# Patient Record
Sex: Female | Born: 2000 | Race: White | Hispanic: No | Marital: Single | Smoking: Current some day smoker
Health system: Southern US, Community
[De-identification: ages and names within clinical notes are randomized; demographics above are authoritative.]

## PROBLEM LIST (undated history)

## (undated) DIAGNOSIS — H539 Unspecified visual disturbance: Secondary | ICD-10-CM

## (undated) DIAGNOSIS — M94269 Chondromalacia, unspecified knee: Secondary | ICD-10-CM

## (undated) DIAGNOSIS — F329 Major depressive disorder, single episode, unspecified: Secondary | ICD-10-CM

## (undated) DIAGNOSIS — F419 Anxiety disorder, unspecified: Secondary | ICD-10-CM

## (undated) DIAGNOSIS — F32A Depression, unspecified: Secondary | ICD-10-CM

## (undated) DIAGNOSIS — M25361 Other instability, right knee: Secondary | ICD-10-CM

---
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
## 2018-04-28 ENCOUNTER — Other Ambulatory Visit: Payer: Self-pay

## 2018-04-28 ENCOUNTER — Encounter (HOSPITAL_COMMUNITY): Payer: Self-pay | Admitting: Emergency Medicine

## 2018-04-28 DIAGNOSIS — F419 Anxiety disorder, unspecified: Secondary | ICD-10-CM | POA: Insufficient documentation

## 2018-04-28 DIAGNOSIS — F329 Major depressive disorder, single episode, unspecified: Secondary | ICD-10-CM | POA: Insufficient documentation

## 2018-04-28 DIAGNOSIS — R45851 Suicidal ideations: Secondary | ICD-10-CM | POA: Insufficient documentation

## 2018-04-28 DIAGNOSIS — F17200 Nicotine dependence, unspecified, uncomplicated: Secondary | ICD-10-CM | POA: Insufficient documentation

## 2018-04-28 DIAGNOSIS — Z79899 Other long term (current) drug therapy: Secondary | ICD-10-CM | POA: Insufficient documentation
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
## 2018-04-29 ENCOUNTER — Emergency Department (HOSPITAL_COMMUNITY)
Admission: EM | Admit: 2018-04-29 | Discharge: 2018-04-29 | Disposition: A | Discharge status: Other Acute Inpatient Hospital | Payer: Medicaid Other | Attending: Emergency Medicine | Admitting: Emergency Medicine

## 2018-04-29 ENCOUNTER — Encounter (HOSPITAL_COMMUNITY): Payer: Self-pay | Admitting: Emergency Medicine

## 2018-04-29 ENCOUNTER — Other Ambulatory Visit: Payer: Self-pay

## 2018-04-29 ENCOUNTER — Inpatient Hospital Stay (HOSPITAL_COMMUNITY)
Admission: AD | Admit: 2018-04-29 | Discharge: 2018-05-04 | DRG: 885 | Disposition: A | Discharge status: Home / Self Care | Payer: Medicaid Other | Source: Intra-hospital | Attending: Psychiatry | Admitting: Psychiatry

## 2018-04-29 ENCOUNTER — Encounter (HOSPITAL_COMMUNITY): Payer: Self-pay | Admitting: *Deleted

## 2018-04-29 DIAGNOSIS — Z818 Family history of other mental and behavioral disorders: Secondary | ICD-10-CM | POA: Diagnosis not present

## 2018-04-29 DIAGNOSIS — Z8719 Personal history of other diseases of the digestive system: Secondary | ICD-10-CM | POA: Diagnosis not present

## 2018-04-29 DIAGNOSIS — F603 Borderline personality disorder: Secondary | ICD-10-CM | POA: Diagnosis present

## 2018-04-29 DIAGNOSIS — R45851 Suicidal ideations: Secondary | ICD-10-CM | POA: Diagnosis present

## 2018-04-29 DIAGNOSIS — F481 Depersonalization-derealization syndrome: Secondary | ICD-10-CM | POA: Diagnosis present

## 2018-04-29 DIAGNOSIS — Z91410 Personal history of adult physical and sexual abuse: Secondary | ICD-10-CM

## 2018-04-29 DIAGNOSIS — F333 Major depressive disorder, recurrent, severe with psychotic symptoms: Principal | ICD-10-CM | POA: Diagnosis present

## 2018-04-29 DIAGNOSIS — F4320 Adjustment disorder, unspecified: Secondary | ICD-10-CM | POA: Diagnosis present

## 2018-04-29 DIAGNOSIS — F32A Depression, unspecified: Secondary | ICD-10-CM

## 2018-04-29 DIAGNOSIS — F419 Anxiety disorder, unspecified: Secondary | ICD-10-CM | POA: Diagnosis present

## 2018-04-29 DIAGNOSIS — F17290 Nicotine dependence, other tobacco product, uncomplicated: Secondary | ICD-10-CM | POA: Diagnosis present

## 2018-04-29 DIAGNOSIS — G4700 Insomnia, unspecified: Secondary | ICD-10-CM | POA: Diagnosis present

## 2018-04-29 DIAGNOSIS — F1290 Cannabis use, unspecified, uncomplicated: Secondary | ICD-10-CM | POA: Diagnosis present

## 2018-04-29 DIAGNOSIS — F329 Major depressive disorder, single episode, unspecified: Secondary | ICD-10-CM

## 2018-04-29 LAB — COMPREHENSIVE METABOLIC PANEL
ALT: 12 U/L (ref 0–44)
AST: 15 U/L (ref 15–41)
Albumin: 4.2 g/dL (ref 3.5–5.0)
Alkaline Phosphatase: 29 U/L — ABNORMAL LOW (ref 38–126)
Anion gap: 8 (ref 5–15)
BUN: 12 mg/dL (ref 6–20)
CHLORIDE: 107 mmol/L (ref 98–111)
CO2: 24 mmol/L (ref 22–32)
CREATININE: 0.86 mg/dL (ref 0.44–1.00)
Calcium: 9.7 mg/dL (ref 8.9–10.3)
Glucose, Bld: 100 mg/dL — ABNORMAL HIGH (ref 70–99)
POTASSIUM: 3.8 mmol/L (ref 3.5–5.1)
Sodium: 139 mmol/L (ref 135–145)
TOTAL PROTEIN: 7.3 g/dL (ref 6.5–8.1)
Total Bilirubin: 0.4 mg/dL (ref 0.3–1.2)

## 2018-04-29 LAB — ETHANOL

## 2018-04-29 LAB — SALICYLATE LEVEL

## 2018-04-29 LAB — CBC
HEMATOCRIT: 40.1 % (ref 36.0–46.0)
Hemoglobin: 13.2 g/dL (ref 12.0–15.0)
MCH: 28.5 pg (ref 26.0–34.0)
MCHC: 32.9 g/dL (ref 30.0–36.0)
MCV: 86.6 fL (ref 80.0–100.0)
NRBC: 0 % (ref 0.0–0.2)
Platelets: 292 10*3/uL (ref 150–400)
RBC: 4.63 MIL/uL (ref 3.87–5.11)
RDW: 11.5 % (ref 11.5–15.5)
WBC: 9.3 10*3/uL (ref 4.0–10.5)

## 2018-04-29 LAB — RAPID URINE DRUG SCREEN, HOSP PERFORMED
Amphetamines: NOT DETECTED
BARBITURATES: NOT DETECTED
BENZODIAZEPINES: NOT DETECTED
Cocaine: NOT DETECTED
Opiates: NOT DETECTED
Tetrahydrocannabinol: NOT DETECTED

## 2018-04-29 LAB — PREGNANCY, URINE: Preg Test, Ur: NEGATIVE

## 2018-04-29 LAB — ACETAMINOPHEN LEVEL: Acetaminophen (Tylenol), Serum: <10 ug/mL — ABNORMAL LOW (ref 10–30)

## 2018-04-29 MED ORDER — ACETAMINOPHEN 325 MG PO TABS: 650.0000 mg | ORAL_TABLET | Freq: Four times a day (QID) | ORAL | Status: DC | PRN

## 2018-04-29 MED ORDER — ESCITALOPRAM OXALATE 20 MG PO TABS
20.0000 mg | ORAL_TABLET | Freq: Every day | ORAL | Status: DC
  Administered 2018-04-30 – 2018-05-04 (×5): 20 mg via ORAL
  Filled 2018-04-29 (×6): qty 1

## 2018-04-29 MED ORDER — LAMOTRIGINE 100 MG PO TABS
100.0000 mg | ORAL_TABLET | Freq: Every day | ORAL | Status: DC
  Administered 2018-04-29: 100 mg via ORAL
  Filled 2018-04-29: qty 1

## 2018-04-29 MED ORDER — ALUM & MAG HYDROXIDE-SIMETH 200-200-20 MG/5ML PO SUSP: 30.0000 mL | ORAL | Status: DC | PRN

## 2018-04-29 MED ORDER — ESCITALOPRAM OXALATE 10 MG PO TABS
20.0000 mg | ORAL_TABLET | Freq: Every day | ORAL | Status: DC
  Administered 2018-04-29: 20 mg via ORAL
  Filled 2018-04-29: qty 2

## 2018-04-29 MED ORDER — QUETIAPINE FUMARATE 50 MG PO TABS
50.0000 mg | ORAL_TABLET | Freq: Every day | ORAL | Status: DC
  Administered 2018-04-29: 100 mg via ORAL
  Filled 2018-04-29 (×3): qty 2

## 2018-04-29 MED ORDER — LAMOTRIGINE 100 MG PO TABS
100.0000 mg | ORAL_TABLET | Freq: Every day | ORAL | Status: DC
  Administered 2018-04-29 – 2018-05-03 (×5): 100 mg via ORAL
  Filled 2018-04-29 (×8): qty 1

## 2018-04-29 MED ORDER — QUETIAPINE FUMARATE 50 MG PO TABS
50.0000 mg | ORAL_TABLET | Freq: Every day | ORAL | Status: DC
  Administered 2018-04-29: 50 mg via ORAL
  Filled 2018-04-29: qty 1

## 2018-04-29 MED ORDER — MAGNESIUM HYDROXIDE 400 MG/5ML PO SUSP: 30.0000 mL | Freq: Every day | ORAL | Status: DC | PRN

## 2018-04-29 MED ORDER — NICOTINE 21 MG/24HR TD PT24
21.0000 mg | MEDICATED_PATCH | Freq: Every day | TRANSDERMAL | Status: DC
  Administered 2018-04-30 – 2018-05-03 (×5): 21 mg via TRANSDERMAL
  Filled 2018-04-29 (×9): qty 1

## 2018-04-29 NOTE — ED Notes (Signed)
Pt discharged safely with [facility] driver.  Pt was in no distress.  No belongings to send as they went home with family.

## 2018-04-29 NOTE — Plan of Care (Signed)
Nurse discussed anxiety, depression, coping skills with patient. 

## 2018-04-29 NOTE — BH Assessment (Addendum)
[facility] Assessment Progress Note  Per Juanetta Beets, DO, this pt requires psychiatric hospitalization at this time.  Thurman Coyer, RN, [facility] has pre-assigned pt to [facility] Rm [id number] in anticipation of a discharge scheduled for later today; [facility] will call when they are ready to receive pt.  Pt has signed Voluntary Admission and Consent for Treatment, as well as Consent to Release Information to pt's providers at [facility], as well as the counseling center at [facility], and notification calls have been placed.  Signed forms have been faxed to [facility].  Pt's nurse, Kendal Hymen, has been notified, and agrees to send original paperwork along with pt via Juel Burrow, and to call report to [phone].  Doylene Canning, [place] Behavioral Health Coordinator [phone]   Addendum:  Per Arloa Koh, RN, [facility] is now ready to receive pt.  Pt's nurse, Kendal Hymen, has been notified.  Doylene Canning, [place] Behavioral Health Coordinator [phone]

## 2018-04-29 NOTE — ED Notes (Signed)
Bed: [facility] Expected date:  Expected time:  Means of arrival:  Comments: 
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
## 2018-04-29 NOTE — ED Notes (Signed)
Bed: [facility] Expected date:  Expected time:  Means of arrival:  Comments: Hold for triage 4 

## 2018-04-29 NOTE — ED Notes (Signed)
Pt alert and oriented, pt pleasant and cooperative, Pt denies any pain, si, hi,or avh at this time. Pt answered all questions approprietly. Pt stable will continue to monitor.
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
## 2018-04-29 NOTE — ED Notes (Signed)
[name], PA, patient meets inpatient criteria. Selena Batten, [facility], no open beds at [facility]. TTS will secure placement.
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
## 2018-04-30 DIAGNOSIS — F333 Major depressive disorder, recurrent, severe with psychotic symptoms: Principal | ICD-10-CM

## 2018-04-30 DIAGNOSIS — F419 Anxiety disorder, unspecified: Secondary | ICD-10-CM

## 2018-04-30 DIAGNOSIS — F603 Borderline personality disorder: Secondary | ICD-10-CM

## 2018-04-30 MED ORDER — HYDROXYZINE HCL 50 MG PO TABS
50.0000 mg | ORAL_TABLET | Freq: Four times a day (QID) | ORAL | Status: DC | PRN
  Filled 2018-04-30: qty 10

## 2018-04-30 MED ORDER — QUETIAPINE FUMARATE 200 MG PO TABS
200.0000 mg | ORAL_TABLET | Freq: Every day | ORAL | Status: DC
  Administered 2018-04-30 – 2018-05-03 (×4): 200 mg via ORAL
  Filled 2018-04-30 (×6): qty 1

## 2018-04-30 NOTE — Progress Notes (Signed)
Patient self inventory- Patient slept fair last night, sleep medication was requested and was helpful. Appetite has been good, energy level low, and concentration good. Depression, hopelessness, and anxiety rated 10, 10, 8. Denies withfrawal symptoms. Denies SI HI AVH. Denies physical pain. Patient said they do not have a goal today.  Patient is compliant with medications prescribed per protocol. No side effects noted.  Safety is maintained with 15 minute checks as well as environmental checks. Will continue to monitor.

## 2018-04-30 NOTE — BHH Suicide Risk Assessment (Signed)
[facility] Admission Suicide Risk Assessment   Nursing information obtained from:  Patient Demographic factors:  Adolescent or young adult Current Mental Status:  Suicidal ideation indicated by patient Loss Factors:  NA Historical Factors:  Prior suicide attempts Risk Reduction Factors:  Employed, Living with another person, especially a relative  Total Time spent with patient: 1 hour Principal Problem: MDD (major depressive disorder), recurrent, severe, with psychosis (HCC) Diagnosis:   Patient Active Problem List   Diagnosis Date Noted  . Borderline personality disorder (HCC) [F60.3] 04/30/2018  . MDD (major depressive disorder), recurrent, severe, with psychosis (HCC) [F33.3] 04/29/2018  . Depressive disorder [F32.9] 06/20/2015  . 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 Migraine without aura and without status migrainosus, not intractable [G43.009] 09/18/2014  . Episodic tension type headache [G44.219] 09/18/2014   Subjective Data: see H&P  Continued Clinical Symptoms:  Alcohol Use Disorder Identification Test Final Score (AUDIT): 2 The "Alcohol Use Disorders Identification Test", Guidelines for Use in Primary Care, Second Edition.  World Science writer [facility]). Score between 0-7:  no or low risk or alcohol related problems. Score between 8-15:  moderate risk of alcohol related problems. Score between 16-19:  high risk of alcohol related problems. Score 20 or above:  warrants further diagnostic evaluation for alcohol dependence and treatment.   CLINICAL FACTORS:   Depression:   Insomnia Severe Personality Disorders:   Cluster B More than one psychiatric diagnosis   Psychiatric Specialty Exam: Physical Exam  Nursing note and vitals reviewed.     Blood pressure 104/70, pulse 90, temperature 98.3 F (36.8 C), temperature source Oral, resp. rate 16, height 5' 2.5" (1.588 m), weight 57.6 kg, last menstrual period 04/27/2018.Body mass index is 22.86 kg/m.     COGNITIVE FEATURES THAT CONTRIBUTE TO RISK:  Polarized thinking    SUICIDE RISK:   Moderate:  Frequent suicidal ideation with limited intensity, and duration, some specificity in terms of plans, no associated intent, good self-control, limited dysphoria/symptomatology, some risk factors present, and identifiable protective factors, including available and accessible social support.  PLAN OF CARE: see H&P  I certify that inpatient services furnished can reasonably be expected to improve the patient's condition.   Micheal Likens, MD 04/30/2018, 12:29 PM

## 2018-04-30 NOTE — Progress Notes (Signed)
Recreation Therapy Notes  Date: 11.1.19 Time: 0930 Location: [address] Dayroom  Group Topic: Stress Management  Goal Area(s) Addresses:  Patient will verbalize importance of using healthy stress management.  Patient will identify positive emotions associated with healthy stress management.   Intervention: Stress Management  Activity :  Guided Imagery.  LRT introduced the stress management technique of guided imagery.  LRT read a script allowing patients to visualize being in a peaceful meadow.  Patients were to listen as script was read to engage in the meditation.  Education:  Stress Management, Discharge Planning.   Education Outcome: Acknowledges edcuation/In group clarification offered/Needs additional education  Clinical Observations/Feedback: Pt did not attend group.      [name], LRT/CTRS         [name] A 04/30/2018 11:37 AM

## 2018-04-30 NOTE — BHH Suicide Risk Assessment (Signed)
[facility] INPATIENT:  Family/Significant Other Suicide Prevention Education  Suicide Prevention Education:  Education Completed; with mother, Caedyn Tassinari [phone]) has been identified by the patient as the family member/significant other with whom the patient will be residing, and identified as the person(s) who will aid the patient in the event of a mental health crisis (suicidal ideations/suicide attempt).  With written consent from the patient, the family member/significant other has been provided the following suicide prevention education, prior to the and/or following the discharge of the patient.  The suicide prevention education provided includes the following:  Suicide risk factors  Suicide prevention and interventions  National Suicide Hotline telephone number  [facility] assessment telephone number  [facility] Emergency Assistance 911  [facility] and/or Residential Mobile Crisis Unit telephone number  Request made of family/significant other to:  Remove weapons (e.g., guns, rifles, knives), all items previously/currently identified as safety concern.  Annabelle Harman confirmed that there were no guns in the home.  Remove drugs/medications (over-the-counter, prescriptions, illicit drugs), all items previously/currently identified as a safety concern.  The family member/significant other verbalizes understanding of the suicide prevention education information provided.  The family member/significant other agrees to remove the items of safety concern listed above.  Cherie Bohaboy 04/30/2018, 4:02 PM

## 2018-04-30 NOTE — Plan of Care (Signed)
  Problem: Education: Goal: Knowledge of [facility] Education information/materials will improve Outcome: Progressing Goal: Verbalization of understanding the information provided will improve Outcome: Progressing   Problem: Activity: Goal: Sleeping patterns will improve Outcome: Progressing   Problem: Education: Goal: Emotional status will improve Outcome: Not Progressing Goal: Mental status will improve Outcome: Not Progressing
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
## 2018-04-30 NOTE — Plan of Care (Signed)
  Problem: Activity: Goal: Sleeping patterns will improve Outcome: Progressing Note:  Slept 6.75 hours last night per flowsheet.    

## 2018-04-30 NOTE — Tx Team (Signed)
Interdisciplinary Treatment and Diagnostic Plan Update  04/30/2018 Time of Session:  [name] MRN: [id number]  Principal Diagnosis: <principal problem not specified>  Secondary Diagnoses: Active Problems:   MDD (major depressive disorder), recurrent severe, without psychosis ([place])   Current Medications:  Current Facility-Administered Medications  Medication Dose Route Frequency Provider Last Rate Last Dose  . acetaminophen (TYLENOL) tablet 650 mg  650 mg Oral Q6H PRN [name], NP      . alum & mag hydroxide-simeth (MAALOX/MYLANTA) 200-200-20 MG/5ML suspension 30 mL  30 mL Oral Q4H PRN [name], NP      . escitalopram (LEXAPRO) tablet 20 mg  20 mg Oral Daily [name], NP   20 mg at 04/30/18 0753  . lamoTRIgine (LAMICTAL) tablet 100 mg  100 mg Oral QHS [name], NP   100 mg at 04/29/18 2127  . magnesium hydroxide (MILK OF MAGNESIA) suspension 30 mL  30 mL Oral Daily PRN [name], NP      . nicotine (NICODERM CQ - dosed in mg/24 hours) patch 21 mg  21 mg Transdermal Daily [name], [name], MD   21 mg at 04/30/18 0753  . QUEtiapine (SEROQUEL) tablet 50-100 mg  50-100 mg Oral QHS [name], NP   100 mg at 04/29/18 2128   PTA Medications: Medications Prior to Admission  Medication Sig Dispense Refill Last Dose  . escitalopram (LEXAPRO) 20 MG tablet Take 20 mg by mouth daily.  1 Past Week at Unknown time  . lamoTRIgine (LAMICTAL) 100 MG tablet Take 100 mg by mouth at bedtime.   Past Week at Unknown time  . norethindrone-ethinyl estradiol (MICROGESTIN,JUNEL,LOESTRIN) 1-20 MG-MCG tablet Take 1 tablet by mouth daily.  13 Past Week at Unknown time  . QUEtiapine (SEROQUEL) 100 MG tablet Take 50-100 mg by mouth at bedtime.  0 Past Week at Unknown time    Patient Stressors:    Patient Strengths:    Treatment Modalities: Medication Management, Group therapy, Case management,  1 to 1 session with clinician,  Psychoeducation, Recreational therapy.   Physician Treatment Plan for Primary Diagnosis: <principal problem not specified> Long Term Goal(s):     Short Term Goals:    Medication Management: Evaluate patient's response, side effects, and tolerance of medication regimen.  Therapeutic Interventions: 1 to 1 sessions, Unit Group sessions and Medication administration.  Evaluation of Outcomes: Not Met  Physician Treatment Plan for Secondary Diagnosis: Active Problems:   MDD (major depressive disorder), recurrent severe, without psychosis ([place])  Long Term Goal(s):     Short Term Goals:       Medication Management: Evaluate patient's response, side effects, and tolerance of medication regimen.  Therapeutic Interventions: 1 to 1 sessions, Unit Group sessions and Medication administration.  Evaluation of Outcomes: Not Met   RN Treatment Plan for Primary Diagnosis: <principal problem not specified> Long Term Goal(s): Knowledge of disease and therapeutic regimen to maintain health will improve  Short Term Goals: Ability to verbalize feelings will improve, Ability to disclose and discuss suicidal ideas, Ability to identify and develop effective coping behaviors will improve and Compliance with prescribed medications will improve  Medication Management: RN will administer medications as ordered by provider, will assess and evaluate patient's response and provide education to patient for prescribed medication. RN will report any adverse and/or side effects to prescribing provider.  Therapeutic Interventions: 1 on 1 counseling sessions, Psychoeducation, Medication administration, Evaluate responses to treatment, Monitor vital signs and CBGs as ordered, Perform/monitor  CIWA, COWS, AIMS and Fall Risk screenings as ordered, Perform wound care treatments as ordered.  Evaluation of Outcomes: Not Met   LCSW Treatment Plan for Primary Diagnosis: <principal problem not specified> Long Term Goal(s):  Safe transition to appropriate next level of care at discharge, Engage patient in therapeutic group addressing interpersonal concerns.  Short Term Goals: Engage patient in aftercare planning with referrals and resources  Therapeutic Interventions: Assess for all discharge needs, 1 to 1 time with Social worker, Explore available resources and support systems, Assess for adequacy in community support network, Educate family and significant other(s) on suicide prevention, Complete Psychosocial Assessment, Interpersonal group therapy.  Evaluation of Outcomes: Not Met   Progress in Treatment: Attending groups: No. Participating in groups: No. Taking medication as prescribed: Yes. Toleration medication: Yes. Family/Significant other contact made: No, will contact:  if patient provides consent for collateral contacts Patient understands diagnosis: Yes. Discussing patient identified problems/goals with staff: Yes. Medical problems stabilized or resolved: Yes. Denies suicidal/homicidal ideation: Yes. Issues/concerns per patient self-inventory: No. Other:    New problem(s) identified: None   New Short Term/Long Term Goal(s):Detox, medication stabilization, elimination of SI thoughts, development of comprehensive mental wellness plan.   Patient Goals:    Discharge Plan or Barriers: CSW will assess for appropriate referrals and possible discharge planning.   Reason for Continuation of Hospitalization: Anxiety Depression Medication stabilization Suicidal ideation  Estimated Length of Stay: 3-5 days   Attendees: Patient: 04/30/2018 11:11 AM  Physician: Dr. Maris Berger, MD 04/30/2018 11:11 AM  Nursing: Yetta Flock.Jacinto Reap, RN 04/30/2018 11:11 AM  RN Care Manager: Lars Pinks, RN  04/30/2018 11:11 AM  Social Worker: Radonna Ricker, [place] 04/30/2018 11:11 AM  Recreational Therapist: X 04/30/2018 11:11 AM  Other: Agustina Caroli, NP  04/30/2018 11:11 AM  Other:  04/30/2018 11:11 AM  Other:  04/30/2018 11:11 AM    Scribe for Treatment Team: Marylee Floras, [place] 04/30/2018 11:11 AM
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
## 2018-04-30 NOTE — Progress Notes (Signed)
D: Pt was reading in their room upon initial approach.  Pt presents with depressed affect and mood.  Pt describes their day as "okay."  Pt's goal is to "start brushing my teeth at night."  Pt forwards little information.  Pt reports the best part of their day was "I found somebody to sit with at lunch."  Pt denies SI/HI, denies hallucinations, denies pain.  Pt has been visible in milieu interacting with peers and staff cautiously.    A: Introduced self to pt.  Met with pt 1:1.  Actively listened to pt and offered support and encouragement.  Medications administered per order.  Q15 minute safety checks maintained.  R: Pt is safe on the unit.  Pt is compliant with medications.  Pt verbally contracts for safety.  Will continue to monitor and assess.

## 2018-05-01 LAB — LIPID PANEL
CHOL/HDL RATIO: 5.8 ratio
CHOLESTEROL: 225 mg/dL — AB (ref 0–200)
HDL: 39 mg/dL — ABNORMAL LOW (ref >40)
LDL Cholesterol: 151 mg/dL — ABNORMAL HIGH (ref 0–99)
Triglycerides: 173 mg/dL — ABNORMAL HIGH (ref <150)
VLDL: 35 mg/dL (ref 0–40)

## 2018-05-01 LAB — HEMOGLOBIN A1C
Hgb A1c MFr Bld: 4.7 % — ABNORMAL LOW (ref 4.8–5.6)
MEAN PLASMA GLUCOSE: 88.19 mg/dL

## 2018-05-01 LAB — TSH: TSH: 1.491 u[IU]/mL (ref 0.350–4.500)

## 2018-05-01 MED ORDER — FLUTICASONE PROPIONATE 50 MCG/ACT NA SUSP
2.0000 | Freq: Every day | NASAL | Status: DC
  Administered 2018-05-01: 2 via NASAL
  Filled 2018-05-01: qty 16

## 2018-05-01 MED ORDER — LORATADINE 10 MG PO TABS
10.0000 mg | ORAL_TABLET | Freq: Every day | ORAL | Status: DC
  Administered 2018-05-01 – 2018-05-04 (×4): 10 mg via ORAL
  Filled 2018-05-01 (×5): qty 1

## 2018-05-01 NOTE — [facility] Group Notes (Signed)
Adult Psychoeducational Group Note  Date:  05/01/2018 Time:  9:56 PM  Group Topic/Focus:  Wrap-Up Group:   The focus of this group is to help patients review their daily goal of treatment and discuss progress on daily workbooks.  Participation Level:  Active  Participation Quality:  Appropriate and Attentive  Affect:  Appropriate  Cognitive:  Alert and Appropriate  Insight: Appropriate and Good  Engagement in Group:  Engaged  Modes of Intervention:  Discussion and Education  Additional Comments:  Pt attended and participated in wrap up group this evening. Pt rated their day a 5/10, due to it "not being a terrible day, but not a good day". Pt rested today and was able to see their mother. Pt goal for tomorrow, is to receive information on finding them a therapist.   Chrisandra Netters 05/01/2018, 9:56 PM

## 2018-05-01 NOTE — Progress Notes (Signed)
D: Patient presents depressed, guarded. She slept well last night and received medication that was helpful. Her appetite is good, energy low and concentration good. She rates her depression 8/10, feeling of hopelessness 2/10 and anxiety 9/10. She denies physical complaints or withdrawal symptoms. Patient denies SI/HI/AVH.  A: Patient checked q15 min, and checks reviewed. Reviewed medication changes with patient and educated on side effects. Educated patient on importance of attending group therapy sessions and educated on several coping skills. Encouarged participation in milieu through recreation therapy and attending meals with peers. Support and encouragement provided. Fluids offered. R: Patient receptive to education on medications, and is medication compliant. Patient contracts for safety on the unit. Goal: "I want to work on showing up to group on time."

## 2018-05-01 NOTE — Progress Notes (Addendum)
[facility] MD Progress Note  05/01/2018 10:57 AM [name]  MRN:  [id number]   Subjective: Patient reports today that she is feeling somewhat better.  She reports sleeping very well last night, and credits that to her increased dose of Seroquel.  She feels that her medications are not appropriate doses and does not wish to change them any further until she can stabilize.  She feels that she needs some continued therapy and that she needs to find a new therapist upon discharge from the hospital.  She reports that she goes to try a psychiatric and has a psychiatrist there.  However, she also reports that she has not seen her psychiatrist in approximately 4 months.  Today she denies any suicidal or homicidal ideations and denies any hallucinations.  She reports that her suicidal thoughts come and go and it does not have to be triggered by much.  She stated "it can be the little things and then I am like I guess it is better to kill myself then to deal with this."  Objective: Patient's chart and findings reviewed and discussed with treatment team.  Patient presents in her room and is lying in the bed but she is awake.  She is pleasant, calm, and cooperative.  Patient has not been seen interacting with peers or staff on the unit.  Patient has been isolated to her room.  We will continue to encourage group therapy sessions  Principal Problem: MDD (major depressive disorder), recurrent, severe, with psychosis (HCC) Diagnosis:   Patient Active Problem List   Diagnosis Date Noted  . Borderline personality disorder (HCC) [F60.3] 04/30/2018  . MDD (major depressive disorder), recurrent, severe, with psychosis (HCC) [F33.3] 04/29/2018  . Depressive disorder [F32.9] 06/20/2015  . 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 Migraine without aura and without status migrainosus, not intractable [G43.009] 09/18/2014  . Episodic tension type headache [G44.219] 09/18/2014   Total Time spent with patient: 20  minutes  Past Psychiatric History: See H&P  Past Medical History:  Past Medical History:  Diagnosis Date  . Anxiety   . Chondromalacia of knee    right  . Depression   . Patellar instability of right knee   . Vision abnormalities    wears glasses    Past Surgical History:  Procedure Laterality Date  . CYST EXCISION Left 12/25/1999   inclusion cyst forehead  . KIDNEY SURGERY  02/2004   for reflux at age 15 at [facility]  . KNEE ARTHROSCOPY WITH MEDIAL PATELLAR FEMORAL LIGAMENT RECONSTRUCTION Right 10/04/2013   Procedure: RIGHT ARTHROSCOPY KNEE, EXCISION OF LOOSE BODIES, CHONDROPLASTY , PATELLA FEMORAL LIGAMENT RECONSTRUCTION ;  Surgeon: Velna Ochs, MD;  Location: [facility];  Service: Orthopedics;  Laterality: Right;  . TONSILLECTOMY  09/2007   [facility]   Family History: History reviewed. No pertinent family history. Family Psychiatric  History: See H&P Social History:  Social History   Substance and Sexual Activity  Alcohol Use Yes  . Alcohol/week: 3.0 standard drinks  . Types: 3 Shots of liquor per week   Comment: may drink 3 drink liquor every 3 months     Social History   Substance and Sexual Activity  Drug Use No    Social History   Socioeconomic History  . Marital status: Single    Spouse name: Not on file  . Number of children: Not on file  . Years of education: 13 yrs   . Highest education level: 12th  grade  Occupational History  . Occupation: Musician work  Engineer, production  . Financial resource strain: Not very hard  . Food insecurity:    Worry: Never true    Inability: Never true  . Transportation needs:    Medical: No    Non-medical: No  Tobacco Use  . Smoking status: Current Some Day Smoker    Packs/day: 0.50    Types: E-cigarettes  . Smokeless tobacco: Never Used  . Tobacco comment: vapor approximately 5 yrs  Substance and Sexual Activity  . Alcohol use: Yes    Alcohol/week: 3.0 standard  drinks    Types: 3 Shots of liquor per week    Comment: may drink 3 drink liquor every 3 months  . Drug use: No  . Sexual activity: Yes    Birth control/protection: Pill  Lifestyle  . Physical activity:    Days per week: 7 days    Minutes per session: 20 min  . Stress: To some extent  Relationships  . Social connections:    Talks on phone: More than three times a week    Gets together: More than three times a week    Attends religious service: Never    Active member of club or organization: No    Attends meetings of clubs or organizations: Never    Relationship status: Never married  Other Topics Concern  . Not on file  Social History Narrative  . Not on file   Additional Social History:    Pain Medications: see MAR Prescriptions: see MAR Over the Counter: see MAR History of alcohol / drug use?: Yes Longest period of sobriety (when/how long): unsure Negative Consequences of Use: Work / School Withdrawal Symptoms: Other (Comment)(anxiety)                    Sleep: Good  Appetite:  Good  Current Medications: Current Facility-Administered Medications  Medication Dose Route Frequency Provider Last Rate Last Dose  . acetaminophen (TYLENOL) tablet 650 mg  650 mg Oral Q6H PRN Laveda Abbe, NP      . alum & mag hydroxide-simeth (MAALOX/MYLANTA) 200-200-20 MG/5ML suspension 30 mL  30 mL Oral Q4H PRN Laveda Abbe, NP      . escitalopram (LEXAPRO) tablet 20 mg  20 mg Oral Daily Laveda Abbe, NP   20 mg at 05/01/18 0842  . fluticasone (FLONASE) 50 MCG/ACT nasal spray 2 spray  2 spray Each Nare Daily Money, Gerlene Burdock, FNP      . hydrOXYzine (ATARAX/VISTARIL) tablet 50 mg  50 mg Oral Q6H PRN Micheal Likens, MD      . lamoTRIgine (LAMICTAL) tablet 100 mg  100 mg Oral QHS Laveda Abbe, NP   100 mg at 04/30/18 2111  . loratadine (CLARITIN) tablet 10 mg  10 mg Oral Daily Money, Gerlene Burdock, FNP      . magnesium hydroxide (MILK OF  MAGNESIA) suspension 30 mL  30 mL Oral Daily PRN Laveda Abbe, NP      . nicotine (NICODERM CQ - dosed in mg/24 hours) patch 21 mg  21 mg Transdermal Daily Diantha Paxson, Rockey Situ, MD   21 mg at 05/01/18 0842  . QUEtiapine (SEROQUEL) tablet 200 mg  200 mg Oral QHS Micheal Likens, MD   200 mg at 04/30/18 2111    Lab Results:  Results for orders placed or performed during the hospital encounter of 04/29/18 (from the past 48 hour(s))  Hemoglobin A1c  Status: Abnormal   Collection Time: 05/01/18  6:24 AM  Result Value Ref Range   Hgb A1c MFr Bld 4.7 (L) 4.8 - 5.6 %    Comment: (NOTE) Pre diabetes:          5.7%-6.4% Diabetes:              >6.4% Glycemic control for   <7.0% adults with diabetes    Mean Plasma Glucose 88.19 mg/dL    Comment: Performed at [facility], [address] [place], [place] [address]  TSH     Status: None   Collection Time: 05/01/18  6:24 AM  Result Value Ref Range   TSH 1.491 0.350 - 4.500 uIU/mL    Comment: Performed by a 3rd Generation assay with a functional sensitivity of <=0.01 uIU/mL. Performed at [facility], [address] [place], [place] [address]     Blood Alcohol level:  Lab Results  Component Value Date   ETH <10 04/29/2018   ETH <5 06/20/2015    Metabolic Disorder Labs: Lab Results  Component Value Date   HGBA1C 4.7 (L) 05/01/2018   MPG 88.19 05/01/2018   No results found for: PROLACTIN No results found for: CHOL, TRIG, HDL, CHOLHDL, VLDL, LDLCALC  Physical Findings: AIMS: Facial and Oral Movements Muscles of Facial Expression: None, normal Lips and Perioral Area: None, normal Jaw: None, normal Tongue: None, normal,Extremity Movements Upper (arms, wrists, hands, fingers): None, normal Lower (legs, knees, ankles, toes): None, normal, Trunk Movements Neck, shoulders, hips: None, normal, Overall Severity Severity of abnormal movements (highest score from questions above): None,  normal Incapacitation due to abnormal movements: None, normal Patient's awareness of abnormal movements (rate only patient's report): No Awareness, Dental Status Current problems with teeth and/or dentures?: No Does patient usually wear dentures?: No  CIWA:  CIWA-Ar Total: 2 COWS:  COWS Total Score: 2  Musculoskeletal: Strength & Muscle Tone: within normal limits Gait & Station: normal Patient leans: N/A  Psychiatric Specialty Exam: Physical Exam  Nursing note and vitals reviewed. Constitutional: She is oriented to person, place, and time. She appears well-developed and well-nourished.  Respiratory: Effort normal.  Musculoskeletal: Normal range of motion.  Neurological: She is alert and oriented to person, place, and time.  Skin: Skin is warm.    Review of Systems  Constitutional: Negative.   HENT: Negative.   Eyes: Negative.   Respiratory: Negative.   Cardiovascular: Negative.   Gastrointestinal: Negative.   Genitourinary: Negative.   Musculoskeletal: Negative.   Skin: Negative.   Neurological: Negative.   Endo/Heme/Allergies: Negative.   Psychiatric/Behavioral: Positive for depression. Negative for suicidal ideas. The patient is nervous/anxious.     Blood pressure 114/72, pulse (!) 109, temperature 97.7 F (36.5 C), temperature source Oral, resp. rate 20, height 5' 2.5" (1.588 m), weight 57.6 kg, last menstrual period 04/27/2018.Body mass index is 22.86 kg/m.  General Appearance: Casual  Eye Contact:  Fair  Speech:  Clear and Coherent and Normal Rate  Volume:  Decreased  Mood:  Depressed  Affect:  Flat  Thought Process:  Linear and Descriptions of Associations: Intact  Orientation:  Full (Time, Place, and Person)  Thought Content:  WDL  Suicidal Thoughts:  No  Homicidal Thoughts:  No  Memory:  Immediate;   Good Recent;   Good Remote;   Good  Judgement:  Fair  Insight:  Fair  Psychomotor Activity:  Normal  Concentration:  Concentration: Good and Attention  Span: Good  Recall:  Good  Fund  of Knowledge:  Good  Language:  Good  Akathisia:  No  Handed:  Right  AIMS (if indicated):     Assets:  Communication Skills Desire for Improvement Financial Resources/Insurance Housing Physical Health Social Support Transportation  ADL's:  Intact  Cognition:  WNL  Sleep:  Number of Hours: 6.75   Problems addressed MDD severe recurrent  Treatment Plan Summary: Daily contact with patient to assess and evaluate symptoms and progress in treatment, Medication management and Plan is to: Continue Seroquel 200 mg p.o. nightly for mood stability Continue Lamictal 100 mg p.o. nightly for mood stability Continue Vistaril 50 mg p.o. every 6 hours as needed for anxiety Continue Lexapro 20 mg p.o. daily for mood stability Start fluticasone nasal spray 2 sprays each nare daily for sinus congestion Start Claritin 10 mg p.o. daily for sinus congestion and allergies Encourage group therapy participation  Maryfrances Bunnell, FNP 05/01/2018, 10:57 AM   .Agree with NP Progress Note

## 2018-05-01 NOTE — [facility] Group Notes (Signed)
LCSW Group Therapy Note  05/01/2018   10:30-11:30am   Type of Therapy and Topic:  Group Therapy: Anger, a Secondary Emotion  Participation Level:  Active  Description of Group:   In this group, patients learned how to recognize the underlying emotions when they become angry as well as the frequent behavioral responses they have to anger-provoking situations.  They identified a recent time they became angry and how they reacted.  They analyzed how their reaction was possibly beneficial and how it was possibly unhelpful.  The group discussed a variety of healthier coping skills that could help with such a situation in the future, including taking time to think about the underlying primary emotion and figuring out ways to deal with that before it escalates to anger.  Therapeutic Goals: 1. Patients will remember their last incident of anger and rate it on a scale of 1-10, identifying the severity for them personally. 2. Patients will identify how their behavior at that time worked for them, as well as how it worked against them. 3. Patients will explore possible new behaviors to use in future anger situations. 4. Patients will learn that anger itself is normal and cannot be eliminated, and that healthier reactions can assist with resolving conflict rather than worsening situations.  Summary of Patient Progress:  The patient stated that they try to keep their anger under control because the message growing up was that anger was not good and should be kept hidden.  The patient interacted well throughout group and gave general comments frequently.  Therapeutic Modalities:   Cognitive Behavioral Therapy Discussion  Lynnell Chad

## 2018-05-02 NOTE — Progress Notes (Signed)
[facility] MD Progress Note  05/02/2018 10:56 AM [name]  MRN:  [id number]   Subjective: patient reports partial improvement compared to admission, overall feeling better and less anxious. As improves, is future oriented, and currently hoping for discharge  soon.  Denies medication side effects. Denies suicidal ideations.   Objective:  I have reviewed chart notes and have met with patient. 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 At this time reports improvement compared to admission, feels " better" , denies suicidal ideations and presents future oriented . Presents future oriented and more focused on discharge planning . Denies medication side effects- currently on Lexapro, Lamictal, Seroquel. No disruptive or agitated behaviors on unit, pleasant on approach.  Principal Problem: MDD (major depressive disorder), recurrent, severe, with psychosis ([place]) Diagnosis:   Patient Active Problem List   Diagnosis Date Noted  . Borderline personality disorder ([place]) [F60.3] 04/30/2018  . MDD (major depressive disorder), recurrent, severe, with psychosis ([place]) [F33.3] 04/29/2018  . Depressive disorder [F32.9] 06/20/2015  . 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 Migraine without aura and without status migrainosus, not intractable [G43.009] 09/18/2014  . Episodic tension type headache [G44.219] 09/18/2014   Total Time spent with patient: 20 minutes  Past Psychiatric History: See H&P  Past Medical History:  Past Medical History:  Diagnosis Date  . Anxiety   . Chondromalacia of knee    right  . Depression   . Patellar instability of right knee   . Vision abnormalities    wears glasses    Past Surgical History:  Procedure Laterality Date  . CYST EXCISION  Left 12/25/1999   inclusion cyst forehead  . KIDNEY SURGERY  02/2004   for reflux at age 89 at [place] LIGAMENT RECONSTRUCTION Right 10/04/2013   Procedure: RIGHT ARTHROSCOPY KNEE, EXCISION OF LOOSE BODIES, CHONDROPLASTY , PATELLA FEMORAL LIGAMENT RECONSTRUCTION ;  Surgeon: Hessie Dibble, MD;  Location: [place];  Service: Orthopedics;  Laterality: Right;  . TONSILLECTOMY  09/2007   [facility]   Family History: History reviewed. No pertinent family history. Family Psychiatric  History: See H&P Social History:  Social History   Substance and Sexual Activity  Alcohol Use Yes  . Alcohol/week: 3.0 standard drinks  . Types: 3 Shots of liquor per week   Comment: may drink 3 drink liquor every 3 months     Social History   Substance and Sexual Activity  Drug Use No    Social History   Socioeconomic History  . Marital status: Single    Spouse name: Not on file  . Number of children: Not on file  . Years of education: 13 yrs   . Highest education level: 12th grade  Occupational History  . Occupation: Chiropractor work  Scientific laboratory technician  . Financial resource strain: Not very hard  . Food insecurity:    Worry: Never true    Inability: Never true  . Transportation needs:    Medical: No    Non-medical: No  Tobacco Use  . Smoking status: Current Some Day Smoker    Packs/day: 0.50    Types: E-cigarettes  . Smokeless tobacco: Never Used  . Tobacco comment: vapor approximately 5 yrs  Substance and Sexual Activity  .  Alcohol use: Yes    Alcohol/week: 3.0 standard drinks    Types: 3 Shots of liquor per week    Comment: may drink 3 drink liquor every 3 months  . Drug use: No  . Sexual activity: Yes    Birth control/protection: Pill  Lifestyle  . Physical activity:    Days per week: 7 days    Minutes per session: 20 min  . Stress: To some extent  Relationships  . Social connections:     Talks on phone: More than three times a week    Gets together: More than three times a week    Attends religious service: Never    Active member of club or organization: No    Attends meetings of clubs or organizations: Never    Relationship status: Never married  Other Topics Concern  . Not on file  Social History Narrative  . Not on file   Additional Social History:    Pain Medications: see MAR Prescriptions: see MAR Over the Counter: see MAR History of alcohol / drug use?: Yes Longest period of sobriety (when/how long): unsure Negative Consequences of Use: Work / School Withdrawal Symptoms: Other (Comment)(anxiety)  Sleep: Good  Appetite:  Good  Current Medications: Current Facility-Administered Medications  Medication Dose Route Frequency Provider Last Rate Last Dose  . acetaminophen (TYLENOL) tablet 650 mg  650 mg Oral Q6H PRN [name], NP      . alum & mag hydroxide-simeth (MAALOX/MYLANTA) 200-200-20 MG/5ML suspension 30 mL  30 mL Oral Q4H PRN [name], NP      . escitalopram (LEXAPRO) tablet 20 mg  20 mg Oral Daily [name], NP   20 mg at 05/02/18 1007  . fluticasone (FLONASE) 50 MCG/ACT nasal spray 2 spray  2 spray Each Nare Daily Money, Lowry Ram, FNP   2 spray at 05/01/18 1213  . hydrOXYzine (ATARAX/VISTARIL) tablet 50 mg  50 mg Oral Q6H PRN Pennelope Bracken, MD      . lamoTRIgine (LAMICTAL) tablet 100 mg  100 mg Oral QHS [name], NP   100 mg at 05/01/18 2140  . loratadine (CLARITIN) tablet 10 mg  10 mg Oral Daily Money, Lowry Ram, FNP   10 mg at 05/02/18 1007  . magnesium hydroxide (MILK OF MAGNESIA) suspension 30 mL  30 mL Oral Daily PRN [name], NP      . nicotine (NICODERM CQ - dosed in mg/24 hours) patch 21 mg  21 mg Transdermal Daily Sherod Cisse, [name], MD   21 mg at 05/02/18 1007  . QUEtiapine (SEROQUEL) tablet 200 mg  200 mg Oral QHS Pennelope Bracken, MD   200 mg at 05/01/18 2140     Lab Results:  Results for orders placed or performed during the hospital encounter of 04/29/18 (from the past 48 hour(s))  Hemoglobin A1c     Status: Abnormal   Collection Time: 05/01/18  6:24 AM  Result Value Ref Range   Hgb A1c MFr Bld 4.7 (L) 4.8 - 5.6 %    Comment: (NOTE) Pre diabetes:          5.7%-6.4% Diabetes:              >6.4% Glycemic control for   <7.0% adults with diabetes    Mean Plasma Glucose 88.19 mg/dL    Comment: Performed at [facility] [address] [place], [place] [address]  Lipid panel     Status: Abnormal  Collection Time: 05/01/18  6:24 AM  Result Value Ref Range   Cholesterol 225 (H) 0 - 200 mg/dL   Triglycerides 173 (H) <150 mg/dL   HDL 39 (L) >40 mg/dL   Total CHOL/HDL Ratio 5.8 RATIO   VLDL 35 0 - 40 mg/dL   LDL Cholesterol 151 (H) 0 - 99 mg/dL    Comment:        Total Cholesterol/HDL:CHD Risk Coronary Heart Disease Risk Table                     Men   Women  1/2 Average Risk   3.4   3.3  Average Risk       5.0   4.4  2 X Average Risk   9.6   7.1  3 X Average Risk  23.4   11.0        Use the calculated Patient Ratio above and the CHD Risk Table to determine the patient's CHD Risk.        ATP III CLASSIFICATION (LDL):  <100     mg/dL   Optimal  100-129  mg/dL   Near or Above                    Optimal  130-159  mg/dL   Borderline  160-189  mg/dL   High  >190     mg/dL   Very High Performed at [facility] [address], [place], [place] [address]   TSH     Status: None   Collection Time: 05/01/18  6:24 AM  Result Value Ref Range   TSH 1.491 0.350 - 4.500 uIU/mL    Comment: Performed by a 3rd Generation assay with a functional sensitivity of <=0.01 uIU/mL. Performed at [facility], [place] [address] [place], [place] [address]     Blood Alcohol level:  Lab Results  Component Value Date   [facility] <10 04/29/2018   ETH <5 81/44/8185    Metabolic Disorder Labs: Lab Results   Component Value Date   HGBA1C 4.7 (L) 05/01/2018   MPG 88.19 05/01/2018   No results found for: PROLACTIN Lab Results  Component Value Date   CHOL 225 (H) 05/01/2018   TRIG 173 (H) 05/01/2018   HDL 39 (L) 05/01/2018   CHOLHDL 5.8 05/01/2018   VLDL 35 05/01/2018   LDLCALC 151 (H) 05/01/2018    Physical Findings: AIMS: Facial and Oral Movements Muscles of Facial Expression: None, normal Lips and Perioral Area: None, normal Jaw: None, normal Tongue: None, normal,Extremity Movements Upper (arms, wrists, hands, fingers): None, normal Lower (legs, knees, ankles, toes): None, normal, Trunk Movements Neck, shoulders, hips: None, normal, Overall Severity Severity of abnormal movements (highest score from questions above): None, normal Incapacitation due to abnormal movements: None, normal Patient's awareness of abnormal movements (rate only patient's report): No Awareness, Dental Status Current problems with teeth and/or dentures?: No Does patient usually wear dentures?: No  CIWA:  CIWA-Ar Total: 0 COWS:  COWS Total Score: 1  Musculoskeletal: Strength & Muscle Tone: within normal limits Gait & Station: normal Patient leans: N/A  Psychiatric Specialty Exam: Physical Exam  Nursing note and vitals reviewed. Constitutional: She is oriented to person, place, and time. She appears well-developed and well-nourished.  Respiratory: Effort normal.  Musculoskeletal: Normal range of motion.  Neurological: She is alert and oriented to person, place, and time.  Skin: Skin is warm.    Review of Systems  Constitutional: Negative.  HENT: Negative.   Eyes: Negative.   Respiratory: Negative.   Cardiovascular: Negative.   Gastrointestinal: Negative.   Genitourinary: Negative.   Musculoskeletal: Negative.   Skin: Negative.   Neurological: Negative.   Endo/Heme/Allergies: Negative.   Psychiatric/Behavioral: Positive for depression. Negative for suicidal ideas. The patient is  nervous/anxious.   no chest pain, no dyspnea, no vomiting   Blood pressure 111/75, pulse (!) 113, temperature 98.4 F (36.9 C), temperature source Oral, resp. rate 16, height 5' 2.5" (1.588 m), weight 57.6 kg, last menstrual period 04/27/2018.Body mass index is 22.86 kg/m.  General Appearance: Well Groomed  Eye Contact:  fair- improves during session  Speech:  Normal Rate  Volume:  normal  Mood:  improving mood   Affect:  reactive, smiles at times appropriately   Thought Process:  Linear and Descriptions of Associations: Intact  Orientation:  Full (Time, Place, and Person)  Thought Content:  no hallucinations, no delusions,not internally preoccupied   Suicidal Thoughts:  No- currently denies suicidal or self injurious ideations, denies homicidal or violent ideations  Homicidal Thoughts:  No  Memory:  recent and remote grossly intact   Judgement: improving   Insight:  Fair/ improving   Psychomotor Activity:  Normal  Concentration:  Concentration: Good and Attention Span: Good  Recall:  Good  Fund of Knowledge:  Good  Language:  Good  Akathisia:  No  Handed:  Right  AIMS (if indicated):     Assets:  Communication Skills Desire for Improvement Financial Resources/Insurance Housing Physical Health Social Support Transportation  ADL's:  Intact  Cognition:  WNL  Sleep:  Number of Hours: 7.75   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 Today presenting with improving mood and fuller range of affect, no suicidal ideations and currently future oriented . Thus far tolerating Lexapro/Seroquel, Lamictal well , does not endorse side effects.  Treatment Plan Summary: Daily contact with patient to assess and evaluate symptoms and progress in treatment, Medication management and Plan is to:  Encourage group and milieu participation to work on coping skills and symptom reduction Continue Seroquel 200 mg p.o.  nightly for mood stability Continue Lamictal 100 mg p.o. nightly for mood stability Continue Vistaril 50 mg p.o. every 6 hours as needed for anxiety Continue Lexapro 20 mg p.o. daily for mood stability Continue  fluticasone nasal spray 2 sprays each nare daily for sinus congestion Continue  Claritin 10 mg p.o. daily for sinus congestion and allergies Treatment team working on disposition planning options  Jenne Campus, MD 05/02/2018, 10:56 AM   Patient ID: [name], female   DOB: 26-Jul-2000, 17 y.o.   MRN: [id number]

## 2018-05-02 NOTE — Progress Notes (Signed)
Patient ID: [name], female   DOB: 04/28/2001, 17 y.o.   MRN: [id number] DAR Note: Pt observed in the dayroom interacting with peer.  Pt endorsed moderate depression and mild anxiety. Pt affect remain flat. Pt denied pain, SI/HI or AVH. Pt states, "I need to work on myself." Medications offered as prescribed; was med compliant. All patient's questions and concerns addressed. Support, encouragement, and safe environment provided. Will continue to monitor for any changes. 15-minute safety checks continue. Pt attended wrap-up group.

## 2018-05-02 NOTE — [facility] Group Notes (Signed)
Adult Nursing Psychoeducational Group Note  Date:  05/02/2018  Time: 2:30 pm  Group Topic/Focus: Life Skills The purpose of this group is to help patients identify strategies for coping with mental health crisis.  Group discusses possible causes of crisis and ways to manage them effectively. Facilitated By: Harrell Lark RN  Participation Level:  Did Not Attend  Additional Comments:  Patient was invited but did not attend group.  [name] A [name] 05/02/2018 2:30 pm

## 2018-05-02 NOTE — [facility] Group Notes (Signed)
[facility] LCSW Group Therapy Note  05/02/2018  10:00-11:00AM  Type of Therapy and Topic:  Group Therapy:  Adding Supports Including Being Your Own Support  Participation Level:  Active   Description of Group:  Patients in this group were introduced to the concept that additional supports including self-support are an essential part of recovery.  A song entitled "I Need Help!" was played and a group discussion was held in reaction to the idea of needing to add supports.  A song entitled "My Own Hero" was played and a group discussion ensued in which patients stated they could relate to the song and it inspired them to realize they have be willing to help themselves in order to succeed, because other people cannot achieve sobriety or stability for them.  We discussed adding a variety of healthy supports to address the various needs in their lives.  A song was played called "I Know Where I've Been" toward the end of group and used to conduct an inspirational wrap-up to group of remembering how far they have already come in their journey.  Therapeutic Goals: 1)  demonstrate the importance of being a part of one's own support system 2)  discuss reasons people in one's life may eventually be unable to be continually supportive  3)  identify the patient's current support system and   4)  elicit commitments to add healthy supports and to become more conscious of being self-supportive   Summary of Patient Progress:  The patient expressed himself appropriately and frequently throughout group.   Therapeutic Modalities:   Motivational Interviewing Activity  Yoshio Seliga J Grossman-Orr       

## 2018-05-02 NOTE — [facility] Group Notes (Signed)
Pt attended and participated in wrap up group this evening.  

## 2018-05-02 NOTE — Progress Notes (Addendum)
Nursing Progress Note [phone]  Data: Patient presents euthymic and pleasant. Patient compliant with scheduled medications. Patient denies pain/physical complaints. Patient completed self-inventory sheet and rates depression, hopelessness, and anxiety 2, 0 and 0 respectively. Patient rates their sleep and appetite as good and good respectively. Patient states goal for today is to "have a good day".  Patient is seen attending groups and visible in the milieu. Patient currently denies SI/HI/AVH.   Action: Patient is educated about and provided medication per provider's orders. Patient safety maintained with q15 min safety checks and frequent rounding. Low fall risk precautions in place. Emotional support given. 1:1 interaction and active listening provided. Patient encouraged to attend meals, groups, and work on treatment plan and goals. Labs, vital signs and patient behavior monitored throughout shift.   Response: Patient remains safe on the unit at this time and agrees to come to staff with any issues/concerns. Patient is interacting with peers appropriately on the unit. Will continue to support and monitor.

## 2018-05-02 NOTE — Plan of Care (Signed)
  Problem: Education: Goal: Emotional status will improve Outcome: Progressing Pt appeared euthymic and pleasant this morning; shared no complaints.  Goal: Mental status will improve Outcome: Progressing Pt has goal to have a good day and "make it less depressing". Problem: Activity: Goal: Interest or engagement in activities will improve Outcome: Progressing Pt is active in the milieu and participating in groups.

## 2018-05-03 LAB — PROLACTIN: Prolactin: 29.2 ng/mL — ABNORMAL HIGH (ref 4.8–23.3)

## 2018-05-03 NOTE — Progress Notes (Signed)
D: Patient is alert, oriented, pleasant, cooperative. Patient presents as pleasant, anxious, sad, and preoccupied. Patient interaction is cautious. Patient denies SI, HI, AVH, and verbally contracts for safety. Patient reports preference of they/them pronouns. Patient denies physical symptoms/pain.    A: Scheduled medications administered per MD order. Support provided. Patient educated on safety on the unit and medications. Routine safety checks every 15 minutes. Patient stated understanding to tell nurse about any new physical symptoms. Patient understands to tell staff of any needs.     R: No adverse drug reactions noted. Patient verbally contracts for safety. Patient remains safe at this time and will continue to monitor.

## 2018-05-03 NOTE — Plan of Care (Signed)
  Problem: Health Behavior/Discharge Planning: Goal: Identification of resources available to assist in meeting health care needs will improve Outcome: Progressing Pt. Has requested new community therapist and social work has made this a priority for today. Goal: Compliance with treatment plan for underlying cause of condition will improve Outcome: Progressing Pt has goal of being more active in the milieu and keeping track of time.

## 2018-05-03 NOTE — Plan of Care (Signed)
  Problem: Education: Goal: Knowledge of [facility] Education information/materials will improve Outcome: Progressing Goal: Emotional status will improve Outcome: Progressing   Patient oriented to the unit. Patient denies SI and is getting along well in the milieu. Patient remains safe and will continue to monitor.

## 2018-05-03 NOTE — Progress Notes (Signed)
[facility] MD Progress Note  05/03/2018 2:21 PM [name]  MRN:  [id number]   Subjective: patient reports improving mood , states " feeling better" than on admission, denies medication side effects at this time.  Objective:  I have reviewed chart notes and have met with patient. 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 Presents with improving mood and fuller range of affect . States feeling significantly better than prior to admission. Denies SI or self injurious ideations. No agitated or disruptive behaviors, pleasant on approach, calm. Some group participation. Tolerating Lexapro, Seroquel, Lamictal well thus far , denies side effects.   Principal Problem: MDD (major depressive disorder), recurrent, severe, with psychosis ([place]) Diagnosis:   Patient Active Problem List   Diagnosis Date Noted  . Borderline personality disorder ([place]) [F60.3] 04/30/2018  . MDD (major depressive disorder), recurrent, severe, with psychosis ([place]) [F33.3] 04/29/2018  . Depressive disorder [F32.9] 06/20/2015  . 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 Migraine without aura and without status migrainosus, not intractable [G43.009] 09/18/2014  . Episodic tension type headache [G44.219] 09/18/2014   Total Time spent with patient: 15 minutes  Past Psychiatric History: See H&P  Past Medical History:  Past Medical History:  Diagnosis Date  . Anxiety   . Chondromalacia of knee    right  . Depression   . Patellar instability of right knee   . Vision abnormalities    wears glasses    Past Surgical History:  Procedure Laterality Date  . CYST EXCISION Left 12/25/1999   inclusion cyst forehead  . KIDNEY SURGERY  02/2004   for reflux at age 56 at [place] LIGAMENT RECONSTRUCTION Right 10/04/2013   Procedure: RIGHT ARTHROSCOPY KNEE, EXCISION OF LOOSE BODIES, CHONDROPLASTY , PATELLA FEMORAL LIGAMENT RECONSTRUCTION ;  Surgeon: Hessie Dibble, MD;  Location: [place];  Service: Orthopedics;  Laterality: Right;  . TONSILLECTOMY  09/2007   [facility]   Family History: History reviewed. No pertinent family history. Family Psychiatric  History: See H&P Social History:  Social History   Substance and Sexual Activity  Alcohol Use Yes  . Alcohol/week: 3.0 standard drinks  . Types: 3 Shots of liquor per week   Comment: may drink 3 drink liquor every 3 months     Social History   Substance and Sexual Activity  Drug Use No    Social History   Socioeconomic History  . Marital status: Single    Spouse name: Not on file  . Number of children: Not on file  . Years of education: 13 yrs   . Highest education level: 12th grade  Occupational History  . Occupation: Chiropractor work  Scientific laboratory technician  . Financial resource strain: Not very hard  . Food insecurity:    Worry: Never true    Inability: Never true  . Transportation needs:    Medical: No    Non-medical: No  Tobacco Use  . Smoking status: Current Some Day Smoker    Packs/day: 0.50    Types: E-cigarettes  . Smokeless tobacco: Never Used  . Tobacco comment: vapor approximately 5 yrs  Substance and Sexual Activity  . Alcohol use: Yes    Alcohol/week: 3.0 standard drinks    Types: 3 Shots  of liquor per week    Comment: may drink 3 drink liquor every 3 months  . Drug use: No  . Sexual activity: Yes    Birth control/protection: Pill  Lifestyle  . Physical activity:    Days per week: 7 days    Minutes per session: 20 min  . Stress: To some extent  Relationships  . Social connections:    Talks on phone: More than three times a week    Gets together: More than three times a week    Attends religious service: Never     Active member of club or organization: No    Attends meetings of clubs or organizations: Never    Relationship status: Never married  Other Topics Concern  . Not on file  Social History Narrative  . Not on file   Additional Social History:    Pain Medications: see MAR Prescriptions: see MAR Over the Counter: see MAR History of alcohol / drug use?: Yes Longest period of sobriety (when/how long): unsure Negative Consequences of Use: Work / School Withdrawal Symptoms: Other (Comment)(anxiety)  Sleep: Good  Appetite:  Good  Current Medications: Current Facility-Administered Medications  Medication Dose Route Frequency Provider Last Rate Last Dose  . acetaminophen (TYLENOL) tablet 650 mg  650 mg Oral Q6H PRN [name], NP      . alum & mag hydroxide-simeth (MAALOX/MYLANTA) 200-200-20 MG/5ML suspension 30 mL  30 mL Oral Q4H PRN [name], NP      . escitalopram (LEXAPRO) tablet 20 mg  20 mg Oral Daily [name], NP   20 mg at 05/03/18 0816  . fluticasone (FLONASE) 50 MCG/ACT nasal spray 2 spray  2 spray Each Nare Daily Money, Lowry Ram, FNP   2 spray at 05/01/18 1213  . hydrOXYzine (ATARAX/VISTARIL) tablet 50 mg  50 mg Oral Q6H PRN Pennelope Bracken, MD      . lamoTRIgine (LAMICTAL) tablet 100 mg  100 mg Oral QHS [name], NP   100 mg at 05/02/18 2146  . loratadine (CLARITIN) tablet 10 mg  10 mg Oral Daily Money, Lowry Ram, FNP   10 mg at 05/03/18 0816  . magnesium hydroxide (MILK OF MAGNESIA) suspension 30 mL  30 mL Oral Daily PRN [name], NP      . nicotine (NICODERM CQ - dosed in mg/24 hours) patch 21 mg  21 mg Transdermal Daily , [name], MD   21 mg at 05/03/18 0818  . QUEtiapine (SEROQUEL) tablet 200 mg  200 mg Oral QHS Pennelope Bracken, MD   200 mg at 05/02/18 2146    Lab Results:  No results found for this or any previous visit (from the past 48 hour(s)).  Blood Alcohol level:  Lab Results   Component Value Date   ETH <10 04/29/2018   ETH <5 94/17/4081    Metabolic Disorder Labs: Lab Results  Component Value Date   HGBA1C 4.7 (L) 05/01/2018   MPG 88.19 05/01/2018   Lab Results  Component Value Date   PROLACTIN 29.2 (H) 05/01/2018   Lab Results  Component Value Date   CHOL 225 (H) 05/01/2018   TRIG 173 (H) 05/01/2018   HDL 39 (L) 05/01/2018   CHOLHDL 5.8 05/01/2018   VLDL 35 05/01/2018   LDLCALC 151 (H) 05/01/2018    Physical Findings: AIMS: Facial and Oral Movements Muscles of Facial Expression: None, normal Lips and Perioral Area: None, normal Jaw: None, normal Tongue: None, normal,Extremity Movements  Upper (arms, wrists, hands, fingers): None, normal Lower (legs, knees, ankles, toes): None, normal, Trunk Movements Neck, shoulders, hips: None, normal, Overall Severity Severity of abnormal movements (highest score from questions above): None, normal Incapacitation due to abnormal movements: None, normal Patient's awareness of abnormal movements (rate only patient's report): No Awareness, Dental Status Current problems with teeth and/or dentures?: No Does patient usually wear dentures?: No  CIWA:  CIWA-Ar Total: 0 COWS:  COWS Total Score: 1  Musculoskeletal: Strength & Muscle Tone: within normal limits Gait & Station: normal Patient leans: N/A  Psychiatric Specialty Exam: Physical Exam  Nursing note and vitals reviewed. Constitutional: She is oriented to person, place, and time. She appears well-developed and well-nourished.  Respiratory: Effort normal.  Musculoskeletal: Normal range of motion.  Neurological: She is alert and oriented to person, place, and time.  Skin: Skin is warm.    Review of Systems  Constitutional: Negative.   HENT: Negative.   Eyes: Negative.   Respiratory: Negative.   Cardiovascular: Negative.   Gastrointestinal: Negative.   Genitourinary: Negative.   Musculoskeletal: Negative.   Skin: Negative.   Neurological:  Negative.   Endo/Heme/Allergies: Negative.   Psychiatric/Behavioral: Positive for depression. Negative for suicidal ideas. The patient is nervous/anxious.   no chest pain, no dyspnea, no vomiting   Blood pressure 127/83, pulse (!) 106, temperature 98.4 F (36.9 C), temperature source Oral, resp. rate 16, height 5' 2.5" (1.588 m), weight 57.6 kg, last menstrual period 04/27/2018.Body mass index is 22.86 kg/m.  General Appearance: Well Groomed  Eye Contact:  Good  Speech:  Normal Rate  Volume:  normal  Mood:  improving mood   Affect:  reactive, fuller in range   Thought Process:  Linear and Descriptions of Associations: Intact  Orientation:  Full (Time, Place, and Person)  Thought Content:  no hallucinations, no delusions,not internally preoccupied   Suicidal Thoughts:  No- currently denies suicidal or self injurious ideations, denies homicidal or violent ideations  Homicidal Thoughts:  No  Memory:  recent and remote grossly intact   Judgement: improving   Insight:  Fair/ improving   Psychomotor Activity:  Normal  Concentration:  Concentration: Good and Attention Span: Good  Recall:  Good  Fund of Knowledge:  Good  Language:  Good  Akathisia:  No  Handed:  Right  AIMS (if indicated):     Assets:  Communication Skills Desire for Improvement Financial Resources/Insurance Housing Physical Health Social Support Transportation  ADL's:  Intact  Cognition:  WNL  Sleep:  Number of Hours: 6.5   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 Today presenting with improving mood and fuller range of affect, no suicidal ideations and currently future oriented . Thus far tolerating Lexapro/Seroquel, Lamictal well , does not endorse side effects.  Today describes improving mood and feeling better than did on admission. Affect brighter and full in range. No current suicidal ideations. Some milieu participation.  Tolerating medications well ( Lexapro, Seroquel, Lamictal) .   Treatment Plan Summary: Daily contact with patient to assess and evaluate symptoms and progress in treatment, Medication management and Plan is to:  Treatment plan reviewed as below today 11/4  Encourage group and milieu participation to work on coping skills and symptom reduction Continue Seroquel 200 mg p.o. nightly for mood stability Continue Lamictal 100 mg p.o. nightly for mood stability Continue Vistaril 50 mg p.o. every 6 hours as needed for anxiety Continue Lexapro 20 mg p.o. daily  for mood stability Continue  fluticasone nasal spray 2 sprays each nare daily for sinus congestion Continue  Claritin 10 mg p.o. daily for sinus congestion and allergies Treatment team working on disposition planning options  Jenne Campus, MD 05/03/2018, 2:21 PM   Patient ID: [name], female   DOB: 2001/04/29, 17 y.o.   MRN: [id number]

## 2018-05-03 NOTE — Plan of Care (Signed)
  Problem: Education: Goal: Knowledge of [facility] Education information/materials will improve Outcome: Progressing   Problem: Coping: Goal: Ability to verbalize frustrations and anger appropriately will improve Outcome: Progressing Goal: Ability to demonstrate self-control will improve Outcome: Progressing   Patient oriented to the unit. Patient sought out this RN to work through emotions regarding activities in the day room. Patient remains safe and will continue to monitor.

## 2018-05-03 NOTE — Progress Notes (Signed)
Nursing Progress Note [phone]  Data: Patient presents pleasant but slightly blunted in expression. Patient complaint with scheduled medications; refused nicotine patch. Patient denies pain/physical complaints. Patient completed self-inventory sheet and rates depression, hopelessness, and anxiety 0, 0 and 0 respectively. Patient rates their sleep and appetite as good and good respectively. Patient states goal for today is to attend at least 2 groups and keep track of time. Patient is seen attending groups and visible in the milieu. Patient currently denies SI/HI/AVH.   Action: Patient is educated about and provided medication per provider's orders. Patient safety maintained with q15 min safety checks and frequent rounding. Low fall risk precautions in place. Emotional support given. 1:1 interaction and active listening provided. Patient encouraged to attend meals, groups, and work on treatment plan and goals. Labs, vital signs and patient behavior monitored throughout shift.   Response: Patient remains safe on the unit at this time and agrees to come to staff with any issues/concerns. Patient is interacting with peers appropriately on the unit. Will continue to support and monitor.

## 2018-05-03 NOTE — Progress Notes (Signed)
Recreation Therapy Notes  Date: 11.4.19 Time: 0930 Location: [address] Dayroom  Group Topic: Stress Management  Goal Area(s) Addresses:  Patient will verbalize importance of using healthy stress management.  Patient will identify positive emotions associated with healthy stress management.   Intervention: Stress Management  Activity :  Meditation.  LRT introduced the stress management technique of meditation.  LRT played a meditation dealing with resilience.  Patients were to listen and follow along with the meditation.  Education:  Stress Management, Discharge Planning.   Education Outcome: Acknowledges edcuation/In group clarification offered/Needs additional education  Clinical Observations/Feedback: Pt did not attend group.     [name], LRT/CTRS         [name], [name] A 05/03/2018 11:04 AM

## 2018-05-03 NOTE — [facility] Group Notes (Signed)
Adult Psychoeducational Group Note  Date:  05/03/2018 Time:  8:57 PM  Group Topic/Focus:  Wrap-Up Group:   The focus of this group is to help patients review their daily goal of treatment and discuss progress on daily workbooks.  Participation Level:  Active  Participation Quality:  Appropriate and Attentive  Affect:  Appropriate  Cognitive:  Alert and Appropriate  Insight: Appropriate and Good  Engagement in Group:  Engaged  Modes of Intervention:  Discussion and Education  Additional Comments:  Pt attended and participated in wrap up group this evening. Pt rated their day a 9/10, due to them not being depressed, anxious or feeling hopeless. Pt completed their goal, which was to attend two groups today.   Chrisandra Netters 05/03/2018, 8:57 PM

## 2018-05-03 NOTE — [facility] Group Notes (Signed)
[facility] Group Notes:  (Nursing/MHT/Case Management/Adjunct)  Date:  05/03/2018  Time:  4:00 PM  Type of Therapy:  Nurse Education  Participation Level:  Did Not Attend  Raylene Miyamoto 05/03/2018, 5:55 PM

## 2018-05-04 MED ORDER — NORETHINDRONE ACET-ETHINYL EST 1-20 MG-MCG PO TABS: 1.0000 | ORAL_TABLET | Freq: Every day | ORAL | 13 refills | Status: DC

## 2018-05-04 MED ORDER — LORATADINE 10 MG PO TABS: 10.0000 mg | ORAL_TABLET | Freq: Every day | ORAL | Status: AC

## 2018-05-04 MED ORDER — FLUTICASONE PROPIONATE 50 MCG/ACT NA SUSP: 2.0000 | Freq: Every day | NASAL | 0 refills | Status: DC

## 2018-05-04 MED ORDER — ESCITALOPRAM OXALATE 20 MG PO TABS: 20.0000 mg | ORAL_TABLET | Freq: Every day | ORAL | 0 refills | Status: DC

## 2018-05-04 MED ORDER — NICOTINE 21 MG/24HR TD PT24: 21.0000 mg | MEDICATED_PATCH | Freq: Every day | TRANSDERMAL | 0 refills | Status: AC

## 2018-05-04 MED ORDER — LAMOTRIGINE 100 MG PO TABS: 100.0000 mg | ORAL_TABLET | Freq: Every day | ORAL | 0 refills | Status: DC

## 2018-05-04 MED ORDER — HYDROXYZINE HCL 50 MG PO TABS: 50.0000 mg | ORAL_TABLET | Freq: Four times a day (QID) | ORAL | 0 refills | Status: AC | PRN

## 2018-05-04 MED ORDER — QUETIAPINE FUMARATE 200 MG PO TABS: 200.0000 mg | ORAL_TABLET | Freq: Every day | ORAL | 0 refills | Status: DC

## 2018-05-04 NOTE — Progress Notes (Signed)
  [facility] Adult Case Management Discharge Plan :  Will you be returning to the same living situation after discharge:  Yes,  patient reports they are returning home with their mother  At discharge, do you have transportation home?: Yes,  patient reports their mother is picking them up at discharge Do you have the ability to pay for your medications: No.  Release of information consent forms completed and in the chart;  Patient's signature needed at discharge.  Patient to Follow up at: Follow-up Information    Center, [facility]. Go on 05/19/2018.   Specialty:  Behavioral Health Why:  Apppointment for therapy services is Wednesday, 05/19/18 at 11:40 am. Please be sure to bring any discharge paperwork from this hospitalization.  Contact information: [address] [place] [address] [phone]           Next level of care provider has access to [facility] Link:yes  Safety Planning and Suicide Prevention discussed: Yes,  with the patient's mother  Have you used any form of tobacco in the last 30 days? (Cigarettes, Smokeless Tobacco, Cigars, and/or Pipes): Yes  Has patient been referred to the Quitline?: Patient refused referral  Patient has been referred for addiction treatment: N/A  Maeola Sarah, LCSWA 05/04/2018, 10:46 AM
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
## 2018-05-04 NOTE — BHH Suicide Risk Assessment (Signed)
[facility] Discharge Suicide Risk Assessment   Principal Problem: MDD (major depressive disorder), recurrent, severe, with psychosis (HCC) Discharge Diagnoses:  Patient Active Problem List   Diagnosis Date Noted  . Borderline personality disorder (HCC) [F60.3] 04/30/2018  . MDD (major depressive disorder), recurrent, severe, with psychosis (HCC) [F33.3] 04/29/2018  . Depressive disorder [F32.9] 06/20/2015  . 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 Migraine without aura and without status migrainosus, not intractable [G43.009] 09/18/2014  . Episodic tension type headache [G44.219] 09/18/2014    Total Time spent with patient: 15 minutes  Musculoskeletal: Strength & Muscle Tone: within normal limits Gait & Station: normal Patient leans: N/A  Psychiatric Specialty Exam: Review of Systems  All other systems reviewed and are negative.   Blood pressure 115/72, pulse (!) 120, temperature 98.5 F (36.9 C), temperature source Oral, resp. rate 16, height 5' 2.5" (1.588 m), weight 57.6 kg, last menstrual period 04/27/2018.Body mass index is 22.86 kg/m.  General Appearance: Casual  Eye Contact::  Fair  Speech:  Normal Rate409  Volume:  Normal  Mood:  Euthymic  Affect:  Congruent  Thought Process:  Coherent and Descriptions of Associations: Intact  Orientation:  Full (Time, Place, and Person)  Thought Content:  Logical  Suicidal Thoughts:  No  Homicidal Thoughts:  No  Memory:  Immediate;   Fair Recent;   Fair Remote;   Fair  Judgement:  Intact  Insight:  Fair  Psychomotor Activity:  Normal  Concentration:  Good  Recall:  Good  Fund of Knowledge:Good  Language: Good  Akathisia:  Negative  Handed:  Right  AIMS (if indicated):     Assets:  Communication Skills Desire for Improvement Housing Physical Health Resilience Social Support  Sleep:  Number of Hours: 6.75  Cognition: WNL  ADL's:  Intact   Mental Status Per Nursing Assessment::   On Admission:  Suicidal ideation  indicated by patient  Demographic Factors:  Adolescent or young adult, Caucasian and Gay, lesbian, or bisexual orientation  Loss Factors: NA  Historical Factors: Impulsivity  Risk Reduction Factors:   Sense of responsibility to family, Living with another person, especially a relative and Positive social support  Continued Clinical Symptoms:  Depression:   Impulsivity  Cognitive Features That Contribute To Risk:  None    Suicide Risk:  Minimal: No identifiable suicidal ideation.  Patients presenting with no risk factors but with morbid ruminations; may be classified as minimal risk based on the severity of the depressive symptoms  Follow-up Information    Center, [facility]. Go on 05/19/2018.   Specialty:  Behavioral Health Why:  Your therapy appointment is Wednesday November 20 at 11:40 a.m. Contact information: [address] [place] [address] [phone]           Plan Of Care/Follow-up recommendations:  Activity:  ad lib  Antonieta Pert, MD 05/04/2018, 8:04 AM

## 2018-05-04 NOTE — Progress Notes (Signed)
D:  Patient's self inventory sheet, patient sleeps good, sleep medicine good.  Good appetite, low energy level, good concentration.  Denied depression, hopeless, anxiety.  Denied withdrawals.  Denied SI.  Denied physical problems.  Denied physical pain.  Goal is discharge.  Plans to talk to MD/SW.   A:  Medications administered per MD orders.  Emotional support and encouragement given patient. R:  Denied SI and HI, contracts for safety.  Denied A/V hallucinations.  Safety maintained with 15 minute checks.

## 2018-05-04 NOTE — Progress Notes (Addendum)
Discharge Note:  Patient discharged home with family member.  Patient denied SI and HI.  Denied A/V hallucinations.  Denied pain.  Suicide prevention information given and discussed with patient who stated pt. understood and had no questions.  [employer] suicide prevention information given to patient.  Patient stated pt received all pt's belongings, clothing, misc items, prescriptions, etc.  Patient stated pt appreciated all the assistance received from [facility].  All required discharge information given to patient and also the survey given to patient.

## 2018-05-04 NOTE — Progress Notes (Signed)
D: Patient is alert, oriented, pleasant, and cooperative. Patient presents with flat facial expression. Patient denies SI, HI, AVH, and verbally contracts for safety. Patient denies physical symptoms/pain.    A: Scheduled medications administered per MD order. Support provided. Patient educated on safety on the unit and medications. Routine safety checks every 15 minutes. Patient stated understanding to tell nurse about any new physical symptoms. Patient understands to tell staff of any needs.     R: No adverse drug reactions noted. Patient verbally contracts for safety. Patient remains safe at this time and will continue to monitor.

## 2020-02-15 ENCOUNTER — Telehealth (HOSPITAL_COMMUNITY): Payer: Self-pay

## 2020-04-07 ENCOUNTER — Telehealth (HOSPITAL_COMMUNITY): Payer: Self-pay | Admitting: Adult Health

## 2020-04-13 ENCOUNTER — Telehealth (HOSPITAL_COMMUNITY): Payer: Self-pay | Admitting: Physician Assistant

## 2020-04-24 ENCOUNTER — Other Ambulatory Visit: Payer: Self-pay

## 2020-04-24 ENCOUNTER — Telehealth (INDEPENDENT_AMBULATORY_CARE_PROVIDER_SITE_OTHER): Payer: No Payment, Other | Admitting: Physician Assistant

## 2020-04-24 DIAGNOSIS — F411 Generalized anxiety disorder: Secondary | ICD-10-CM | POA: Diagnosis not present

## 2020-04-24 DIAGNOSIS — F333 Major depressive disorder, recurrent, severe with psychotic symptoms: Secondary | ICD-10-CM | POA: Diagnosis not present

## 2020-04-24 DIAGNOSIS — F4310 Post-traumatic stress disorder, unspecified: Secondary | ICD-10-CM

## 2020-04-24 DIAGNOSIS — F603 Borderline personality disorder: Secondary | ICD-10-CM

## 2020-04-24 DIAGNOSIS — F319 Bipolar disorder, unspecified: Secondary | ICD-10-CM

## 2020-04-24 MED ORDER — QUETIAPINE FUMARATE 200 MG PO TABS: 200.0000 mg | ORAL_TABLET | Freq: Every day | ORAL | 2 refills | Status: DC

## 2020-04-24 MED ORDER — ESCITALOPRAM OXALATE 20 MG PO TABS: 20.0000 mg | ORAL_TABLET | Freq: Every day | ORAL | 2 refills | Status: DC

## 2020-04-24 MED ORDER — LAMOTRIGINE 100 MG PO TABS: 100.0000 mg | ORAL_TABLET | Freq: Two times a day (BID) | ORAL | 2 refills | Status: DC

## 2020-04-24 NOTE — Progress Notes (Signed)
Psychiatric Initial Adult Assessment   Virtual Visit via Video Note  I connected with [name] on 04/24/2020 at  2:00 PM EDT by a video enabled telemedicine application and verified that I am speaking with the correct person using two identifiers.  Location: Patient: Home Provider: Office   I discussed the limitations of evaluation and management by telemedicine and the availability of in person appointments. The patient expressed understanding and agreed to proceed.  Follow Up Instructions:   I discussed the assessment and treatment plan with the patient. The patient was provided an opportunity to ask questions and all were answered. The patient agreed with the plan and demonstrated an understanding of the instructions.   The patient was advised to call back or seek an in-person evaluation if the symptoms worsen or if the condition fails to improve as anticipated.  I provided 30 minutes of non-face-to-face time during this encounter.  Meta HatchetUchenna E Davine Sweney, PA   Patient Identification: [name] MRN:  [id number] Date of Evaluation:  04/24/2020 [facility]: [facility] Chief Complaint:  "[facility] with psychiatry for medication looking to have medications prescribed and refilled."  Visit Diagnosis:    ICD-10-CM   1. MDD (major depressive disorder), recurrent, severe, with psychosis (HCC)  F33.3 escitalopram (LEXAPRO) 20 MG tablet  2. Borderline personality disorder (HCC)  F60.3 lamoTRIgine (LAMICTAL) 100 MG tablet  3. PTSD (post-traumatic stress disorder)  F43.10 escitalopram (LEXAPRO) 20 MG tablet  4. Generalized anxiety disorder  F41.1 escitalopram (LEXAPRO) 20 MG tablet  5. Bipolar affective disorder, remission status unspecified (HCC)  F31.9 QUEtiapine (SEROQUEL) 200 MG tablet    History of Present Illness:  [name] is a 19 year old, transgender female, with a past psychiatric history significant for major depressive disorder, borderline personality  disorder, PTSD, generalized anxiety disorder, and bipolar disorder who [facility],  "[facility] looking to have medications prescribed and refilled." Patient is currently taking the following medications:  Lamotrigine 100 mg 2 times daily Escitalopram 20 mg 1 time daily Quetiapine 200 mg 1 time daily at bedtime  Patient reports no issues with his medication regimen and is requesting refills on all of his medications. He reports that his mood has been good lately but he has been a little irritated. He attributes his irritation to the testosterone he is currently taking for gender affirmation. He has been on testosterone since November of last year and injects 0.5 mg of testosterone every week. Patient reports no stressors or concern at this time.  Patient denies suicide and homicide ideation. He further denies auditory and visual hallucinations. Patient gets approximately 9 hours of sleep a night and reports feeling well rested upon waking. Patient endorses good appetite and eats 2 - 3 meals a day. Patient endorses alcohol consumption which equates to roughly 2 shots a week. Patient currently vapes but does not smoke any other types of tobacco products. He vapes 30 ml of juice per month and a half (50mg  nicotine). Patient reports the occasional marijuana use. 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  Associated Signs/Symptoms: Depression Symptoms:  psychomotor agitation, psychomotor retardation, (Hypo) Manic Symptoms:  Irritable Mood, Anxiety Symptoms:  Patient experiences bouts of anxiety that go away within the hour Psychotic Symptoms:  N/A PTSD Symptoms: Re-experiencing:  Nightmares  Past Psychiatric History: Borderline Personality Disorder Generalized Anxiety  Disorder Major Depressive Disorder PTSD Bipolar Disorder  Previous  Psychotropic Medications: Yes   Substance Abuse History in the last 12 months:  Yes.    Consequences of Substance Abuse: NA  Past Medical History:  Past Medical History:  Diagnosis Date  . Anxiety   . Chondromalacia of knee    right  . Depression   . Patellar instability of right knee   . Vision abnormalities    wears glasses    Past Surgical History:  Procedure Laterality Date  . CYST EXCISION Left 12/25/1999   inclusion cyst forehead  . KIDNEY SURGERY  02/2004   for reflux at age 6 at [facility]  . KNEE ARTHROSCOPY WITH MEDIAL PATELLAR FEMORAL LIGAMENT RECONSTRUCTION Right 10/04/2013   Procedure: RIGHT ARTHROSCOPY KNEE, EXCISION OF LOOSE BODIES, CHONDROPLASTY , PATELLA FEMORAL LIGAMENT RECONSTRUCTION ;  Surgeon: Velna Ochs, MD;  Location: [facility];  Service: Orthopedics;  Laterality: Right;  . TONSILLECTOMY  09/2007   [facility]    Family Psychiatric History:  Grandmother - Substance abuse Aunt - bipolar disorder  Family History: No family history on file.  Social History:   Social History   Socioeconomic History  . Marital status: Single    Spouse name: Not on file  . Number of children: Not on file  . Years of education: 13 yrs   . Highest education level: 12th grade  Occupational History  . Occupation: restaurant work  Tobacco Use  . Smoking status: Current Some Day Smoker    Packs/day: 0.50    Types: E-cigarettes  . Smokeless tobacco: Never Used  . Tobacco comment: vapor approximately 5 yrs  Vaping Use  . Vaping Use: Every day  . Start date: 04/29/2014  . Last attempt to quit: 04/29/2018  . Devices: tobacco vap started 5 yrs ago  Substance and Sexual Activity  . Alcohol use: Yes    Alcohol/week: 3.0 standard drinks    Types: 3 Shots of liquor per week    Comment: may drink 3 drink liquor every 3 months  . Drug use: No  . Sexual activity: Yes    Birth control/protection: Pill  Other Topics Concern   . Not on file  Social History Narrative  . Not on file   Social Determinants of Health   Financial Resource Strain:   . Difficulty of Paying Living Expenses: Not on file  Food Insecurity:   . Worried About Programme researcher, broadcasting/film/video in the Last Year: Not on file  . Ran Out of Food in the Last Year: Not on file  Transportation Needs:   . Lack of Transportation (Medical): Not on file  . Lack of Transportation (Non-Medical): Not on file  Physical Activity:   . Days of Exercise per Week: Not on file  . Minutes of Exercise per Session: Not on file  Stress:   . Feeling of Stress : Not on file  Social Connections:   . Frequency of Communication with Friends and Family: Not on file  . Frequency of Social Gatherings with Friends and Family: Not on file  . Attends Religious Services: Not on file  . Active Member of Clubs or Organizations: Not on file  . Attends Banker Meetings: Not on file  . Marital Status: Not on file    Additional Social History: Patient is attending school for teaching 1st grade Patient works as a Physiological scientist  Allergies:  No Known Allergies  Metabolic Disorder Labs: Lab Results  Component  Value Date   HGBA1C 4.7 (L) 05/01/2018   MPG 88.19 05/01/2018   Lab Results  Component Value Date   PROLACTIN 29.2 (H) 05/01/2018   Lab Results  Component Value Date   CHOL 225 (H) 05/01/2018   TRIG 173 (H) 05/01/2018   HDL 39 (L) 05/01/2018   CHOLHDL 5.8 05/01/2018   VLDL 35 05/01/2018   LDLCALC 151 (H) 05/01/2018   Lab Results  Component Value Date   TSH 1.491 05/01/2018    Therapeutic Level Labs: No results found for: LITHIUM No results found for: CBMZ No results found for: VALPROATE  Current Medications: Current Outpatient Medications  Medication Sig Dispense Refill  . escitalopram (LEXAPRO) 20 MG tablet Take 1 tablet (20 mg total) by mouth daily. 30 tablet 2  . fluticasone (FLONASE) 50 MCG/ACT nasal spray Place 2 sprays into both nostrils  daily. For allergies  0  . hydrOXYzine (ATARAX/VISTARIL) 50 MG tablet Take 1 tablet (50 mg total) by mouth every 6 (six) hours as needed for anxiety. 60 tablet 0  . lamoTRIgine (LAMICTAL) 100 MG tablet Take 1 tablet (100 mg total) by mouth 2 (two) times daily. 30 tablet 2  . loratadine (CLARITIN) 10 MG tablet Take 1 tablet (10 mg total) by mouth daily. (May buy from over the counter): For allergies    . nicotine (NICODERM CQ - DOSED IN MG/24 HOURS) 21 mg/24hr patch Place 1 patch (21 mg total) onto the skin daily. (May buy from over the counter): For smoking cessation 28 patch 0  . QUEtiapine (SEROQUEL) 200 MG tablet Take 1 tablet (200 mg total) by mouth at bedtime. 30 tablet 2   No current facility-administered medications for this visit.    Musculoskeletal: Strength & Muscle Tone: within normal limits Gait & Station: normal Patient leans: N/A  Psychiatric Specialty Exam: Review of Systems  Psychiatric/Behavioral: Negative for decreased concentration, hallucinations, sleep disturbance and suicidal ideas. The patient is nervous/anxious. The patient is not hyperactive.     There were no vitals taken for this visit.There is no height or weight on file to calculate BMI.  General Appearance: Well Groomed  Eye Contact:  Good  Speech:  Clear and Coherent and Normal Rate  Volume:  Normal  Mood:  Patient was pleasant  Affect:  Appropriate  Thought Process:  Coherent and Goal Directed  Orientation:  Full (Time, Place, and Person)  Thought Content:  WDL and Logical  Suicidal Thoughts:  No  Homicidal Thoughts:  No  Memory:  Immediate;   Good Recent;   Good Remote;   Good  Judgement:  Good  Insight:  Good  Psychomotor Activity:  Normal  Concentration:  Concentration: Good and Attention Span: Good  Recall:  Good  Fund of Knowledge:Good  Language: Good  Akathisia:  Negative  Handed:  Right  AIMS (if indicated):  not done  Assets:  Communication Skills Desire for  Improvement Housing Intimacy Physical Health Social Support Vocational/Educational  ADL's:  Intact  Cognition: WNL  Sleep:  Good   Screenings: AIMS     [facility]  AIMS Total Score 0    AUDIT     Admission (Dis[facility]  Alcohol Use Disorder Identification Test Final Score (AUDIT) 2      Assessment and Plan:  Krystal Bast is a 19 year old, transgender female, with a past psychiatric history significant for major depressive disorder, borderline personality disorder, PTSD, generalized anxiety disorder,  and bipolar disorder who presents to [facility] for,  "I was being seen at [facility] with psychiatry for medication looking to have medications prescribed and refilled." Patient is currently taking the following medications:  Lamotrigine 100 mg 2 times daily Escitalopram 20 mg 1 time daily Quetiapine 200 mg 1 time daily at bedtime  Patient reports no issues with his current medication regimen. Patient is requesting refills on all of his current psychiatric medications. Refills will be placed for requested medications conclusion of the encounter.  1. Borderline personality disorder (HCC)  - lamoTRIgine (LAMICTAL) 100 MG tablet; Take 1 tablet (100 mg total) by mouth 2 (two) times daily.  Dispense: 30 tablet; Refill: 2  2. MDD (major depressive disorder), recurrent, severe, with psychosis (HCC)  - escitalopram (LEXAPRO) 20 MG tablet; Take 1 tablet (20 mg total) by mouth daily.  Dispense: 30 tablet; Refill: 2  3. PTSD (post-traumatic stress disorder)  - escitalopram (LEXAPRO) 20 MG tablet; Take 1 tablet (20 mg total) by mouth daily.  Dispense: 30 tablet; Refill: 2  4. Generalized anxiety disorder  - escitalopram (LEXAPRO) 20 MG tablet; Take 1 tablet (20 mg total) by mouth daily.  Dispense: 30 tablet; Refill: 2  5. Bipolar  affective disorder, remission status unspecified (HCC)  - QUEtiapine (SEROQUEL) 200 MG tablet; Take 1 tablet (200 mg total) by mouth at bedtime.  Dispense: 30 tablet; Refill: 2  Patient to follow up in 6 weeks.   Meta Hatchet, PA 10/26/20212:38 PM

## 2020-04-29 ENCOUNTER — Encounter (HOSPITAL_COMMUNITY): Payer: Self-pay | Admitting: Physician Assistant

## 2020-05-23 ENCOUNTER — Telehealth (INDEPENDENT_AMBULATORY_CARE_PROVIDER_SITE_OTHER): Payer: No Payment, Other | Admitting: Physician Assistant

## 2020-05-23 ENCOUNTER — Other Ambulatory Visit: Payer: Self-pay

## 2020-05-23 DIAGNOSIS — F4310 Post-traumatic stress disorder, unspecified: Secondary | ICD-10-CM | POA: Diagnosis not present

## 2020-05-23 DIAGNOSIS — F411 Generalized anxiety disorder: Secondary | ICD-10-CM | POA: Insufficient documentation

## 2020-05-23 DIAGNOSIS — F333 Major depressive disorder, recurrent, severe with psychotic symptoms: Secondary | ICD-10-CM | POA: Diagnosis not present

## 2020-05-23 DIAGNOSIS — F319 Bipolar disorder, unspecified: Secondary | ICD-10-CM | POA: Diagnosis not present

## 2020-05-23 DIAGNOSIS — F603 Borderline personality disorder: Secondary | ICD-10-CM

## 2020-05-23 MED ORDER — QUETIAPINE FUMARATE 50 MG PO TABS: 50.0000 mg | ORAL_TABLET | Freq: Every day | ORAL | 2 refills | Status: DC

## 2020-05-26 ENCOUNTER — Encounter (HOSPITAL_COMMUNITY): Payer: Self-pay | Admitting: Physician Assistant

## 2020-05-26 NOTE — Progress Notes (Signed)
BH MD/PA/NP OP Progress Note  Virtual Visit via Telephone Note  I connected with [name] on 05/23/2020 at  4:00 PM EST by telephone and verified that I am speaking with the correct person using two identifiers.  Location: Patient: House Provider: Office   I discussed the limitations, risks, security and privacy concerns of performing an evaluation and management service by telephone and the availability of in person appointments. I also discussed with the patient that there may be a patient responsible charge related to this service. The patient expressed understanding and agreed to proceed.  Follow Up Instructions:  I discussed the assessment and treatment plan with the patient. The patient was provided an opportunity to ask questions and all were answered. The patient agreed with the plan and demonstrated an understanding of the instructions.   The patient was advised to call back or seek an in-person evaluation if the symptoms worsen or if the condition fails to improve as anticipated.  I provided 25 minutes of non-face-to-face time during this encounter.   Meta Hatchet, PA   05/23/2020 4:00 pm [name]  MRN:  [id number]  Chief Complaint: Follow up and medication management  HPI:  Krystal Bast is a 19 year old, transgender female, with a past psychiatric history significant for major depressive disorder, borderline personality disorder, PTSD, generalized anxiety disorder, and bipolar disorder who presents to [facility] via virtual video visit for follow up and medication management. Patient states that he is doing ok and that his mood has been good. The only concern the patient has today is that he feels he may be building a tolerance to Quetiapine due to his sleep being affected. Patient states he is still able to receive between 8 - 9 hours of sleep, but it he has had difficulty falling asleep. Patient is currently taking 200 mg of Seroquel. Patient  was recommended increasing the dose from 200 mg to 250 mg. Patient was agreeable to recommendation.  Patient denies suicide and homicide ideation. He further denies auditory and visual hallucinations. Patient endorses good appetite and eat approximately 3 meals a day with some snacking in between. Patient endorses alcohol consumption sparingly. Patient endorses tobacco use in the form of vaping. He endorses vaping the same amount as he did during the previous encounter (He vapes 30 ml of juice per month and a half (50mg  nicotine)). Patient reports smoking marijuana daily.  Visit Diagnosis:    ICD-10-CM   1. Bipolar affective disorder, remission status unspecified (HCC)  F31.9 QUEtiapine (SEROQUEL) 50 MG tablet  2. Borderline personality disorder (HCC)  F60.3   3. MDD (major depressive disorder), recurrent, severe, with psychosis (HCC)  F33.3   4. PTSD (post-traumatic stress disorder)  F43.10   5. Generalized anxiety disorder  F41.1     Past Psychiatric History: Borderline Personality Disorder Generalized Anxiety Disorder Major Depressive Disorder PTSD Bipolar Disorder  Past Medical History:  Past Medical History:  Diagnosis Date  . Anxiety   . Chondromalacia of knee    right  . Depression   . Patellar instability of right knee   . Vision abnormalities    wears glasses    Past Surgical History:  Procedure Laterality Date  . CYST EXCISION Left 12/25/1999   inclusion cyst forehead  . KIDNEY SURGERY  02/2004   for reflux at age 71 at [facility]  . KNEE ARTHROSCOPY WITH MEDIAL PATELLAR FEMORAL LIGAMENT RECONSTRUCTION Right 10/04/2013   Procedure: RIGHT ARTHROSCOPY KNEE, EXCISION OF LOOSE BODIES,  CHONDROPLASTY , PATELLA FEMORAL LIGAMENT RECONSTRUCTION ;  Surgeon: Velna Ochs, MD;  Location: [facility];  Service: Orthopedics;  Laterality: Right;  . TONSILLECTOMY  09/2007   [facility]    Family Psychiatric History: Grandmother - Substance  abuse Aunt - bipolar disorder  Family History: No family history on file.  Social History:  Social History   Socioeconomic History  . Marital status: Single    Spouse name: Not on file  . Number of children: Not on file  . Years of education: 13 yrs   . Highest education level: 12th grade  Occupational History  . Occupation: restaurant work  Tobacco Use  . Smoking status: Current Some Day Smoker    Packs/day: 0.50    Types: E-cigarettes  . Smokeless tobacco: Never Used  . Tobacco comment: vapor approximately 5 yrs  Vaping Use  . Vaping Use: Every day  . Start date: 04/29/2014  . Last attempt to quit: 04/29/2018  . Devices: tobacco vap started 5 yrs ago  Substance and Sexual Activity  . Alcohol use: Yes    Alcohol/week: 3.0 standard drinks    Types: 3 Shots of liquor per week    Comment: may drink 3 drink liquor every 3 months  . Drug use: No  . Sexual activity: Yes    Birth control/protection: Pill  Other Topics Concern  . Not on file  Social History Narrative  . Not on file   Social Determinants of Health   Financial Resource Strain:   . Difficulty of Paying Living Expenses: Not on file  Food Insecurity:   . Worried About Programme researcher, broadcasting/film/video in the Last Year: Not on file  . Ran Out of Food in the Last Year: Not on file  Transportation Needs:   . Lack of Transportation (Medical): Not on file  . Lack of Transportation (Non-Medical): Not on file  Physical Activity:   . Days of Exercise per Week: Not on file  . Minutes of Exercise per Session: Not on file  Stress:   . Feeling of Stress : Not on file  Social Connections:   . Frequency of Communication with Friends and Family: Not on file  . Frequency of Social Gatherings with Friends and Family: Not on file  . Attends Religious Services: Not on file  . Active Member of Clubs or Organizations: Not on file  . Attends Banker Meetings: Not on file  . Marital Status: Not on file    Allergies: No  Known Allergies  Metabolic Disorder Labs: Lab Results  Component Value Date   HGBA1C 4.7 (L) 05/01/2018   MPG 88.19 05/01/2018   Lab Results  Component Value Date   PROLACTIN 29.2 (H) 05/01/2018   Lab Results  Component Value Date   CHOL 225 (H) 05/01/2018   TRIG 173 (H) 05/01/2018   HDL 39 (L) 05/01/2018   CHOLHDL 5.8 05/01/2018   VLDL 35 05/01/2018   LDLCALC 151 (H) 05/01/2018   Lab Results  Component Value Date   TSH 1.491 05/01/2018    Therapeutic Level Labs: No results found for: LITHIUM No results found for: VALPROATE No components found for:  CBMZ  Current Medications: Current Outpatient Medications  Medication Sig Dispense Refill  . escitalopram (LEXAPRO) 20 MG tablet Take 1 tablet (20 mg total) by mouth daily. 30 tablet 2  . fluticasone (FLONASE) 50 MCG/ACT nasal spray Place 2 sprays into both nostrils daily. For allergies  0  . hydrOXYzine (ATARAX/VISTARIL)  50 MG tablet Take 1 tablet (50 mg total) by mouth every 6 (six) hours as needed for anxiety. 60 tablet 0  . lamoTRIgine (LAMICTAL) 100 MG tablet Take 1 tablet (100 mg total) by mouth 2 (two) times daily. 30 tablet 2  . loratadine (CLARITIN) 10 MG tablet Take 1 tablet (10 mg total) by mouth daily. (May buy from over the counter): For allergies    . nicotine (NICODERM CQ - DOSED IN MG/24 HOURS) 21 mg/24hr patch Place 1 patch (21 mg total) onto the skin daily. (May buy from over the counter): For smoking cessation 28 patch 0  . QUEtiapine (SEROQUEL) 200 MG tablet Take 1 tablet (200 mg total) by mouth at bedtime. 30 tablet 2  . QUEtiapine (SEROQUEL) 50 MG tablet Take 1 tablet (50 mg total) by mouth at bedtime. 30 tablet 2   No current facility-administered medications for this visit.     Musculoskeletal: Strength & Muscle Tone: Unable to assess due to telehealth visit Gait & Station: Unable to assess due to telehealth visit Patient leans:  Unable to assess due to telehealth visit  Psychiatric Specialty  Exam: Review of Systems  Psychiatric/Behavioral: Positive for sleep disturbance. Negative for dysphoric mood, hallucinations and suicidal ideas. The patient is not nervous/anxious.     There were no vitals taken for this visit.There is no height or weight on file to calculate BMI.  General Appearance: Well Groomed  Eye Contact:  Good  Speech:  Clear and Coherent and Normal Rate  Volume:  Normal  Mood:  Patient was pleasant  Affect:  Appropriate  Thought Process:  Coherent and Goal Directed  Orientation:  Full (Time, Place, and Person)  Thought Content: WDL and Logical   Suicidal Thoughts:  No  Homicidal Thoughts:  No  Memory:  Immediate;   Good Recent;   Good Remote;   Good  Judgement:  Good  Insight:  Good  Psychomotor Activity:  Normal  Concentration:  Concentration: Good and Attention Span: Good  Recall:  Good  Fund of Knowledge: Good  Language: Good  Akathisia:  NA  Handed:  Right  AIMS (if indicated): not done  Assets:  Communication Skills Desire for Improvement Housing Intimacy Physical Health Social Support Vocational/Educational  ADL's:  Intact  Cognition: WNL  Sleep:  Good   Screenings: AIMS     [facility]  AIMS Total Score 0    AUDIT     Admission (Discharged[facility]  Alcohol Use Disorder Identification Test Final Score (AUDIT) 2       Assessment and Plan:  [name] is a 19 year old, transgender female, with a past psychiatric history significant for major depressive disorder, borderline personality disorder, PTSD, generalized anxiety disorder, and bipolar disorder who [facility] via virtual video visit for follow up and medication management. The only concern the patient has today is that he feels he may be building a tolerance to Seroquel due to his sleep being affected. Patient states  he is still able to receive between 8 - 9 hours of sleep, but it he has had difficulty falling asleep. Patient is currently taking 200 mg of Seroquel. Patient was recommended increasing the dose from 200 mg to 250 mg. Patient was agreeable to recommendation. Patient has no other concerns at this time.  1. Borderline personality disorder (HCC) - lamoTRIgine (LAMICTAL) 100 MG tablet; Take 1 tablet (100 mg total)  by mouth 2 (two) times daily  2. MDD (major depressive disorder), recurrent, severe, with psychosis (HCC) - escitalopram (LEXAPRO) 20 MG tablet; Take 1 tablet (20 mg total) by mouth daily.   3. PTSD (post-traumatic stress disorder) - escitalopram (LEXAPRO) 20 MG tablet; Take 1 tablet (20 mg total) by mouth daily.   4. Generalized anxiety disorder - escitalopram (LEXAPRO) 20 MG tablet; Take 1 tablet (20 mg total) by mouth daily.   5. Bipolar affective disorder, remission status unspecified (HCC) Increased patient's dosage of Seroquel from 200 mg -> 250 mg. Refills were ordered to accommodate the dosage adjustment  - QUEtiapine (SEROQUEL) 50 MG tablet; Take 1 tablet (50 mg total) by mouth at bedtime.  Dispense: 30 tablet; Refill: 2  Patient to follow up in 6 weeks  Meta Hatchet, PA 05/26/2020, 1:53 AM

## 2020-07-18 ENCOUNTER — Telehealth (HOSPITAL_COMMUNITY): Payer: No Payment, Other | Admitting: Physician Assistant

## 2020-07-18 ENCOUNTER — Other Ambulatory Visit: Payer: Self-pay

## 2020-08-15 ENCOUNTER — Telehealth (HOSPITAL_COMMUNITY): Payer: Self-pay | Admitting: *Deleted

## 2020-08-15 NOTE — Telephone Encounter (Signed)
Pharmacy request for her Quetiapine which she should have been out of late last month. She has her next appt with Eddie PA on 09/04/20 at 1 pm. Will bring this rx to his attention to call in to her preferred pharmacy of [facility].

## 2020-08-16 ENCOUNTER — Other Ambulatory Visit (HOSPITAL_COMMUNITY): Payer: Self-pay | Admitting: Physician Assistant

## 2020-08-16 DIAGNOSIS — F319 Bipolar disorder, unspecified: Secondary | ICD-10-CM

## 2020-08-16 MED ORDER — QUETIAPINE FUMARATE 50 MG PO TABS: 50.0000 mg | ORAL_TABLET | Freq: Every day | ORAL | 2 refills | Status: DC

## 2020-08-16 MED ORDER — QUETIAPINE FUMARATE 200 MG PO TABS: 200.0000 mg | ORAL_TABLET | Freq: Every day | ORAL | 2 refills | Status: DC

## 2020-08-16 NOTE — Telephone Encounter (Signed)
Provider was contacted by Orpah Clinton. Reola Calkins, RN regarding patient's medication refill. Patient's medication will be E prescribed to pharmacy of choice. Patient is currently taking Seroquel 250 mg for the management of her bipolar disorder.

## 2020-08-16 NOTE — Progress Notes (Signed)
Provider was contacted by Suzanne K. Beck, RN regarding patient's medication refill. Patient's medication will be E prescribed to pharmacy of choice. Patient is currently taking Seroquel 250 mg for the management of her bipolar disorder.

## 2020-09-04 ENCOUNTER — Other Ambulatory Visit: Payer: Self-pay

## 2020-09-04 ENCOUNTER — Encounter (HOSPITAL_COMMUNITY): Payer: Self-pay | Admitting: Physician Assistant

## 2020-09-04 ENCOUNTER — Telehealth (INDEPENDENT_AMBULATORY_CARE_PROVIDER_SITE_OTHER): Payer: No Payment, Other | Admitting: Physician Assistant

## 2020-09-04 DIAGNOSIS — F603 Borderline personality disorder: Secondary | ICD-10-CM | POA: Diagnosis not present

## 2020-09-04 DIAGNOSIS — F319 Bipolar disorder, unspecified: Secondary | ICD-10-CM | POA: Diagnosis not present

## 2020-09-04 DIAGNOSIS — F4310 Post-traumatic stress disorder, unspecified: Secondary | ICD-10-CM

## 2020-09-04 DIAGNOSIS — F411 Generalized anxiety disorder: Secondary | ICD-10-CM

## 2020-09-04 MED ORDER — ESCITALOPRAM OXALATE 20 MG PO TABS: 20.0000 mg | ORAL_TABLET | Freq: Every day | ORAL | 2 refills | Status: DC

## 2020-09-04 MED ORDER — LAMOTRIGINE 100 MG PO TABS: 100.0000 mg | ORAL_TABLET | Freq: Two times a day (BID) | ORAL | 2 refills | Status: DC
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
## 2020-11-06 ENCOUNTER — Encounter (HOSPITAL_COMMUNITY): Payer: Self-pay | Admitting: Physician Assistant

## 2020-11-06 ENCOUNTER — Telehealth (INDEPENDENT_AMBULATORY_CARE_PROVIDER_SITE_OTHER): Payer: No Payment, Other | Admitting: Physician Assistant

## 2020-11-06 DIAGNOSIS — F411 Generalized anxiety disorder: Secondary | ICD-10-CM | POA: Diagnosis not present

## 2020-11-06 DIAGNOSIS — F4310 Post-traumatic stress disorder, unspecified: Secondary | ICD-10-CM

## 2020-11-06 DIAGNOSIS — F603 Borderline personality disorder: Secondary | ICD-10-CM

## 2020-11-06 DIAGNOSIS — F319 Bipolar disorder, unspecified: Secondary | ICD-10-CM | POA: Diagnosis not present

## 2020-11-06 MED ORDER — LAMOTRIGINE 100 MG PO TABS: 100.0000 mg | ORAL_TABLET | Freq: Two times a day (BID) | ORAL | 2 refills | Status: DC

## 2020-11-06 MED ORDER — QUETIAPINE FUMARATE 200 MG PO TABS: 200.0000 mg | ORAL_TABLET | Freq: Every day | ORAL | 2 refills | Status: DC

## 2020-11-06 MED ORDER — ESCITALOPRAM OXALATE 20 MG PO TABS: 20.0000 mg | ORAL_TABLET | Freq: Every day | ORAL | 2 refills | Status: DC

## 2020-11-06 NOTE — Progress Notes (Signed)
BH MD/PA/NP OP Progress Note  Virtual Visit via Telephone Note  I connected with [name] on 11/06/20 at  1:00 PM EDT by telephone and verified that I am speaking with the correct person using two identifiers.  Location: Patient: Home Provider: Clinic   I discussed the limitations, risks, security and privacy concerns of performing an evaluation and management service by telephone and the availability of in person appointments. I also discussed with the patient that there may be a patient responsible charge related to this service. The patient expressed understanding and agreed to proceed.  Follow Up Instructions:  I discussed the assessment and treatment plan with the patient. The patient was provided an opportunity to ask questions and all were answered. The patient agreed with the plan and demonstrated an understanding of the instructions.   The patient was advised to call back or seek an in-person evaluation if the symptoms worsen or if the condition fails to improve as anticipated.  I provided 21 minutes of non-face-to-face time during this encounter.  Meta HatchetUchenna E Isra Lindy, PA   11/06/2020 8:32 PM Krystal GingerMadison C Capano  MRN:  [id number]  Chief Complaint: Follow-up and medication management  HPI:   [name] "[name]" is a 20 year old, transgender female with a past psychiatric history significant for borderline personality disorder, PTSD, generalized anxiety disorder, and bipolar disorder who [facility] via virtual telephone visit for follow-up and medication management.  Patient is currently taking the following medications:  Lamotrigine 100 mg 2 times daily Escitalopram 20 mg daily Seroquel 250 mg at bedtime  Patient reports that he normally takes 250 mg of Seroquel at bedtime.  In order to take his correct dosage, patient was prescribed 200 mg as well as an additional 50 mg of Seroquel as separate orders.  Patient reports  that he has been without his order for Seroquel 50 mg at bedtime but states that he has been doing fine on 200 mg of Seroquel at bedtime.  Patient reports that he is comfortable with remaining on 200 mg of Seroquel following the conclusion of the encounter.  Patient has no other issues or concerns regarding his current medication regimen.  Patient denies the need for dosage adjustments on his other medications and is requesting refills on all of his medications at this time.  Patient denies any other issues or concerns regarding his mental health.  A PHQ 9 screen was performed with the patient scoring a 13.  A GAD-7 screen was also performed with the patient scoring a 4.  Patient is pleasant, calm, cooperative, and fully engaged in conversation during the encounter.  Patient reports that his mood is good.  Patient denies suicidal or homicidal ideations. He further denies auditory or visual hallucinations and does not appear to be responding to internal/external stimuli.  Patient endorses good sleep and receives on average 8 to 9 hours of sleep each night.  Patient endorses good appetite and eats on average 2 meals per day.  Patient denies recent alcohol consumption.  Patient denies tobacco use but states that he is still vaping.  Patient endorses illicit drug use in the form of marijuana use.  Visit Diagnosis:    ICD-10-CM   1. Bipolar affective disorder, remission status unspecified (HCC)  F31.9 escitalopram (LEXAPRO) 20 MG tablet    QUEtiapine (SEROQUEL) 200 MG tablet  2. PTSD (post-traumatic stress disorder)  F43.10 escitalopram (LEXAPRO) 20 MG tablet  3. Generalized anxiety disorder  F41.1 escitalopram (LEXAPRO) 20  MG tablet  4. Borderline personality disorder (HCC)  F60.3 lamoTRIgine (LAMICTAL) 100 MG tablet    Past Psychiatric History:  Borderline Personality Disorder Generalized Anxiety Disorder PTSD Bipolar Disorder  Past Medical History:  Past Medical History:  Diagnosis Date  .  Anxiety   . Chondromalacia of knee    right  . Depression   . Patellar instability of right knee   . Vision abnormalities    wears glasses    Past Surgical History:  Procedure Laterality Date  . CYST EXCISION Left 12/25/1999   inclusion cyst forehead  . KIDNEY SURGERY  02/2004   for reflux at age 43 at [facility]  . KNEE ARTHROSCOPY WITH MEDIAL PATELLAR FEMORAL LIGAMENT RECONSTRUCTION Right 10/04/2013   Procedure: RIGHT ARTHROSCOPY KNEE, EXCISION OF LOOSE BODIES, CHONDROPLASTY , PATELLA FEMORAL LIGAMENT RECONSTRUCTION ;  Surgeon: Velna Ochs, MD;  Location: [facility];  Service: Orthopedics;  Laterality: Right;  . TONSILLECTOMY  09/2007   [facility]    Family Psychiatric History:  Grandmother - Substance abuse Aunt - bipolar disorder  Family History: History reviewed. No pertinent family history.  Social History:  Social History   Socioeconomic History  . Marital status: Single    Spouse name: Not on file  . Number of children: Not on file  . Years of education: 13 yrs   . Highest education level: 12th grade  Occupational History  . Occupation: restaurant work  Tobacco Use  . Smoking status: Current Some Day Smoker    Packs/day: 0.50    Types: E-cigarettes  . Smokeless tobacco: Never Used  . Tobacco comment: vapor approximately 5 yrs  Vaping Use  . Vaping Use: Every day  . Start date: 04/29/2014  . Last attempt to quit: 04/29/2018  . Devices: tobacco vap started 5 yrs ago  Substance and Sexual Activity  . Alcohol use: Yes    Alcohol/week: 3.0 standard drinks    Types: 3 Shots of liquor per week    Comment: may drink 3 drink liquor every 3 months  . Drug use: No  . Sexual activity: Yes    Birth control/protection: Pill  Other Topics Concern  . Not on file  Social History Narrative  . Not on file   Social Determinants of Health   Financial Resource Strain: Not on file  Food Insecurity: Not on file   Transportation Needs: Not on file  Physical Activity: Not on file  Stress: Not on file  Social Connections: Not on file    Allergies: No Known Allergies  Metabolic Disorder Labs: Lab Results  Component Value Date   HGBA1C 4.7 (L) 05/01/2018   MPG 88.19 05/01/2018   Lab Results  Component Value Date   PROLACTIN 29.2 (H) 05/01/2018   Lab Results  Component Value Date   CHOL 225 (H) 05/01/2018   TRIG 173 (H) 05/01/2018   HDL 39 (L) 05/01/2018   CHOLHDL 5.8 05/01/2018   VLDL 35 05/01/2018   LDLCALC 151 (H) 05/01/2018   Lab Results  Component Value Date   TSH 1.491 05/01/2018    Therapeutic Level Labs: No results found for: LITHIUM No results found for: VALPROATE No components found for:  CBMZ  Current Medications: Current Outpatient Medications  Medication Sig Dispense Refill  . escitalopram (LEXAPRO) 20 MG tablet Take 1 tablet (20 mg total) by mouth daily. 30 tablet 2  . fluticasone (FLONASE) 50 MCG/ACT nasal spray Place 2 sprays into both nostrils daily. For allergies  0  .  hydrOXYzine (ATARAX/VISTARIL) 50 MG tablet Take 1 tablet (50 mg total) by mouth every 6 (six) hours as needed for anxiety. 60 tablet 0  . lamoTRIgine (LAMICTAL) 100 MG tablet Take 1 tablet (100 mg total) by mouth 2 (two) times daily. 30 tablet 2  . loratadine (CLARITIN) 10 MG tablet Take 1 tablet (10 mg total) by mouth daily. (May buy from over the counter): For allergies    . nicotine (NICODERM CQ - DOSED IN MG/24 HOURS) 21 mg/24hr patch Place 1 patch (21 mg total) onto the skin daily. (May buy from over the counter): For smoking cessation 28 patch 0  . QUEtiapine (SEROQUEL) 200 MG tablet Take 1 tablet (200 mg total) by mouth at bedtime. 30 tablet 2   No current facility-administered medications for this visit.    Musculoskeletal: Strength & Muscle Tone: Unable to assess due to telemedicine visit Gait & Station: Unable to assess due to telemedicine visit Patient leans: Unable to assess due to  telemedicine visit  Psychiatric Specialty Exam: Review of Systems  Psychiatric/Behavioral: Negative for decreased concentration, dysphoric mood, hallucinations, self-injury, sleep disturbance and suicidal ideas. The patient is not nervous/anxious and is not hyperactive.     There were no vitals taken for this visit.There is no height or weight on file to calculate BMI.  General Appearance: Unable to assess due to telemedicine visit  Eye Contact:  Unable to assess due to telemedicine visit  Speech:  Clear and Coherent and Normal Rate  Volume:  Normal  Mood:  Euthymic  Affect:  Appropriate  Thought Process:  Coherent and Descriptions of Associations: Intact  Orientation:  Full (Time, Place, and Person)  Thought Content: WDL   Suicidal Thoughts:  No  Homicidal Thoughts:  No  Memory:  Immediate;   Good Recent;   Good Remote;   Good  Judgement:  Good  Insight:  Good  Psychomotor Activity:  Normal  Concentration:  Concentration: Good and Attention Span: Good  Recall:  Good  Fund of Knowledge: Good  Language: Good  Akathisia:  NA  Handed:  Right  AIMS (if indicated): not done  Assets:  Communication Skills Desire for Improvement Housing Intimacy Physical Health Social Support Vocational/Educational  ADL's:  Intact  Cognition: WNL  Sleep:  Good   Screenings: AIMS   Flowsheet Row [facility]  AIMS Total Score 0    AUDIT   Flowsheet Row Ad[facility] ([facility]  Alcohol Use Disorder Identification Test Final Score (AUDIT) 2    GAD-7   Flowsheet Row Video Visit from 11/06/2020 in [facility] Video Visit from 09/04/2020 in [facility]  Total GAD-7 Score 4 6    PHQ2-9   Flowsheet Row Video Visit from 11/06/2020 in [facility] Video Visit from 09/04/2020 in  [place]  PHQ-2 Total Score 3 0  PHQ-9 Total Score 13 --    Flowsheet Row Video Visit from 11/06/2020 in [facility] Video Visit from 09/04/2020 in [facility] (Discharged[facility]  C-SSRS RISK CATEGORY Low Risk Low Risk High Risk       Assessment and Plan:   Danyele C. Mclure "Krystal Bast" is a 20 year old, transgender female with a past psychiatric history significant for borderline personality disorder, PTSD, generalized anxiety disorder, and bipolar disorder who  presents to [facility] via virtual telephone visit for follow-up and medication management.  Patient reports that he would like to stick with taking Seroquel 200 mg at bedtime.  Patient reports no other issues or concerns regarding his current medication regimen.  Patient denies the need for dosage adjustments at this time and is requesting refills on all his medications.  Patient's medications will be e-prescribed to pharmacy of choice.  1. Bipolar affective disorder, remission status unspecified (HCC)  - escitalopram (LEXAPRO) 20 MG tablet; Take 1 tablet (20 mg total) by mouth daily.  Dispense: 30 tablet; Refill: 2 - QUEtiapine (SEROQUEL) 200 MG tablet; Take 1 tablet (200 mg total) by mouth at bedtime.  Dispense: 30 tablet; Refill: 2  2. PTSD (post-traumatic stress disorder)  - escitalopram (LEXAPRO) 20 MG tablet; Take 1 tablet (20 mg total) by mouth daily.  Dispense: 30 tablet; Refill: 2  3. Generalized anxiety disorder  - escitalopram (LEXAPRO) 20 MG tablet; Take 1 tablet (20 mg total) by mouth daily.  Dispense: 30 tablet; Refill: 2  4. Borderline personality disorder (HCC)  - lamoTRIgine (LAMICTAL) 100 MG tablet; Take 1 tablet (100 mg total) by mouth 2 (two) times daily.  Dispense: 30 tablet; Refill: 2  Patient to follow-up in 2  months  Meta Hatchet, PA 11/06/2020, 8:32 PM

## 2021-01-09 ENCOUNTER — Other Ambulatory Visit: Payer: Self-pay

## 2021-01-09 ENCOUNTER — Encounter (HOSPITAL_COMMUNITY): Payer: Self-pay | Admitting: Physician Assistant

## 2021-01-09 ENCOUNTER — Telehealth (INDEPENDENT_AMBULATORY_CARE_PROVIDER_SITE_OTHER): Payer: No Payment, Other | Admitting: Physician Assistant

## 2021-01-09 DIAGNOSIS — F603 Borderline personality disorder: Secondary | ICD-10-CM | POA: Diagnosis not present

## 2021-01-09 DIAGNOSIS — F4310 Post-traumatic stress disorder, unspecified: Secondary | ICD-10-CM

## 2021-01-09 DIAGNOSIS — F319 Bipolar disorder, unspecified: Secondary | ICD-10-CM | POA: Diagnosis not present

## 2021-01-09 DIAGNOSIS — F411 Generalized anxiety disorder: Secondary | ICD-10-CM

## 2021-01-09 MED ORDER — ESCITALOPRAM OXALATE 5 MG PO TABS: 5.0000 mg | ORAL_TABLET | Freq: Every day | ORAL | 0 refills | Status: DC

## 2021-01-09 MED ORDER — QUETIAPINE FUMARATE 200 MG PO TABS: 200.0000 mg | ORAL_TABLET | Freq: Every day | ORAL | 2 refills | Status: DC

## 2021-01-09 MED ORDER — BUPROPION HCL ER (XL) 150 MG PO TB24: 150.0000 mg | ORAL_TABLET | ORAL | 1 refills | Status: DC

## 2021-01-09 MED ORDER — LAMOTRIGINE 100 MG PO TABS: 100.0000 mg | ORAL_TABLET | Freq: Two times a day (BID) | ORAL | 2 refills | Status: DC

## 2021-01-09 NOTE — Progress Notes (Signed)
BH MD/PA/NP OP Progress Note  Virtual Visit via Video Note  I connected with [name] on 01/09/21 at  1:00 PM EDT by a video enabled telemedicine application and verified that I am speaking with the correct person using two identifiers.  Location: Patient: Home Provider: Clinic   I discussed the limitations of evaluation and management by telemedicine and the availability of in person appointments. The patient expressed understanding and agreed to proceed.  Follow Up Instructions:  I discussed the assessment and treatment plan with the patient. The patient was provided an opportunity to ask questions and all were answered. The patient agreed with the plan and demonstrated an understanding of the instructions.   The patient was advised to call back or seek an in-person evaluation if the symptoms worsen or if the condition fails to improve as anticipated.  I provided 20 minutes of non-face-to-face time during this encounter.  Meta Hatchet, PA   01/09/2021 1:13 PM [name]  MRN:  [id number]  Chief Complaint: Follow up and medication management  HPI:   Krystal C. Gunther "Marlene Bast" is a 20 year old, transgender female with a past psychiatric history significant for borderline personality disorder, PTSD, generalized anxiety disorder, and bipolar disorder who presents to [facility] via virtual video visit for follow-up and medication management.  Patient is currently being managed on the following medications:  Lexapro 20 mg daily Seroquel 200 mg at bedtime Lamotrigine 100 mg 2 times daily  Patient reports that he has been experiencing decreased sex drive and weight gain which he attributes to the use of his Lexapro.  He also expresses that he has been experiencing worsening depression characterized by low mood, lack of motivation, and decreased energy.  Patient denies feelings of worthlessness/guilt, irritability, or sleep disturbances.   Patient expresses that his anxiety has not been an issue and rates his anxiety a 2-3 out of 10.  Patient is interested in switching his Lexapro out for another antidepressant that does not cause weight gain.  Patient denies any new stressors and further denies any other issues or concerns regarding his mental health.  A PHQ-9 screen was performed with the patient scoring a 12.  A GAD-7 screen was also performed with the patient scoring a 2.  Patient is alert and oriented x4, pleasant, calm, cooperative, and fully engaged in conversation during the encounter.  Patient states that he is doing okay.  Patient denies suicidal or homicidal ideations.  He further denies auditory or visual hallucinations and does not appear to be responding to internal/external stimuli.  Patient endorses good sleep and receives on average 8 to 9 hours of sleep each night.  Patient endorses good appetite and eats on average 2-1/2 meals per day.  Patient denies alcohol consumption.  He denies tobacco use but states that he still engages in vaping.  He states that he is vaping less frequently.  Patient endorses illicit drug use in the form of marijuana.  Visit Diagnosis:    ICD-10-CM   1. Borderline personality disorder (HCC)  F60.3 lamoTRIgine (LAMICTAL) 100 MG tablet    2. Bipolar affective disorder, remission status unspecified (HCC)  F31.9 escitalopram (LEXAPRO) 5 MG tablet    QUEtiapine (SEROQUEL) 200 MG tablet    buPROPion (WELLBUTRIN XL) 150 MG 24 hr tablet    3. PTSD (post-traumatic stress disorder)  F43.10 escitalopram (LEXAPRO) 5 MG tablet    4. Generalized anxiety disorder  F41.1 escitalopram (LEXAPRO) 5 MG tablet  Past Psychiatric History:  Borderline Personality Disorder Generalized Anxiety Disorder PTSD Bipolar Disorder  Past Medical History:  Past Medical History:  Diagnosis Date   Anxiety    Chondromalacia of knee    right   Depression    Patellar instability of right knee    Vision  abnormalities    wears glasses    Past Surgical History:  Procedure Laterality Date   CYST EXCISION Left 12/25/1999   inclusion cyst forehead   KIDNEY SURGERY  02/2004   for reflux at age 89 [facility]   KNEE ARTHROSCOPY WITH MEDIAL PATELLAR FEMORAL LIGAMENT RECONSTRUCTION Right 10/04/2013   Procedure: RIGHT ARTHROSCOPY KNEE, EXCISION OF LOOSE BODIES, CHONDROPLASTY , PATELLA FEMORAL LIGAMENT RECONSTRUCTION ;  Surgeon: Velna OchsPeter G Dalldorf, MD;  Location: [facility];  Service: Orthopedics;  Laterality: Right;   TONSILLECTOMY  09/2007   [facility]    Family Psychiatric History:  Grandmother - Substance abuse Aunt - bipolar disorder  Family History: History reviewed. No pertinent family history.  Social History:  Social History   Socioeconomic History   Marital status: Single    Spouse name: Not on file   Number of children: Not on file   Years of education: 13 yrs    Highest education level: 12th grade  Occupational History   Occupation: Musicianrestaurant work  Tobacco Use   Smoking status: Some Days    Packs/day: 0.50    Types: E-cigarettes, Cigarettes   Smokeless tobacco: Never   Tobacco comments:    vapor approximately 5 yrs  Vaping Use   Vaping Use: Every day   Start date: 04/29/2014   Last attempt to quit: 04/29/2018   Devices: tobacco vap started 5 yrs ago  Substance and Sexual Activity   Alcohol use: Yes    Alcohol/week: 3.0 standard drinks    Types: 3 Shots of liquor per week    Comment: may drink 3 drink liquor every 3 months   Drug use: No   Sexual activity: Yes    Birth control/protection: Pill  Other Topics Concern   Not on file  Social History Narrative   Not on file   Social Determinants of Health   Financial Resource Strain: Not on file  Food Insecurity: Not on file  Transportation Needs: Not on file  Physical Activity: Not on file  Stress: Not on file  Social Connections: Not on file    Allergies: No Known  Allergies  Metabolic Disorder Labs: Lab Results  Component Value Date   HGBA1C 4.7 (L) 05/01/2018   MPG 88.19 05/01/2018   Lab Results  Component Value Date   PROLACTIN 29.2 (H) 05/01/2018   Lab Results  Component Value Date   CHOL 225 (H) 05/01/2018   TRIG 173 (H) 05/01/2018   HDL 39 (L) 05/01/2018   CHOLHDL 5.8 05/01/2018   VLDL 35 05/01/2018   LDLCALC 151 (H) 05/01/2018   Lab Results  Component Value Date   TSH 1.491 05/01/2018    Therapeutic Level Labs: No results found for: LITHIUM No results found for: VALPROATE No components found for:  CBMZ  Current Medications: Current Outpatient Medications  Medication Sig Dispense Refill   buPROPion (WELLBUTRIN XL) 150 MG 24 hr tablet Take 1 tablet (150 mg total) by mouth every morning. 30 tablet 1   escitalopram (LEXAPRO) 5 MG tablet Take 1 tablet (5 mg total) by mouth daily. Patient to take 5 mg for 3 days, prior to taking 10 mg for 3 days.  3 tablet 0   fluticasone (FLONASE) 50 MCG/ACT nasal spray Place 2 sprays into both nostrils daily. For allergies  0   hydrOXYzine (ATARAX/VISTARIL) 50 MG tablet Take 1 tablet (50 mg total) by mouth every 6 (six) hours as needed for anxiety. 60 tablet 0   lamoTRIgine (LAMICTAL) 100 MG tablet Take 1 tablet (100 mg total) by mouth 2 (two) times daily. 60 tablet 2   loratadine (CLARITIN) 10 MG tablet Take 1 tablet (10 mg total) by mouth daily. (May buy from over the counter): For allergies     nicotine (NICODERM CQ - DOSED IN MG/24 HOURS) 21 mg/24hr patch Place 1 patch (21 mg total) onto the skin daily. (May buy from over the counter): For smoking cessation 28 patch 0   QUEtiapine (SEROQUEL) 200 MG tablet Take 1 tablet (200 mg total) by mouth at bedtime. 30 tablet 2   No current facility-administered medications for this visit.     Musculoskeletal: Strength & Muscle Tone: within normal limits Gait & Station: normal Patient leans: N/A  Psychiatric Specialty Exam: Review of Systems   Psychiatric/Behavioral:  Negative for decreased concentration, dysphoric mood, hallucinations, self-injury, sleep disturbance and suicidal ideas. The patient is not nervous/anxious and is not hyperactive.    There were no vitals taken for this visit.There is no height or weight on file to calculate BMI.  General Appearance: Well Groomed  Eye Contact:  Good  Speech:  Clear and Coherent and Normal Rate  Volume:  Normal  Mood:  Depressed  Affect:  Congruent  Thought Process:  Coherent, Goal Directed, and Descriptions of Associations: Intact  Orientation:  Full (Time, Place, and Person)  Thought Content: WDL   Suicidal Thoughts:  No  Homicidal Thoughts:  No  Memory:  Immediate;   Good Recent;   Good Remote;   Good  Judgement:  Good  Insight:  Good  Psychomotor Activity:  Normal  Concentration:  Concentration: Good and Attention Span: Good  Recall:  Good  Fund of Knowledge: Good  Language: Good  Akathisia:  NA  Handed:  Right  AIMS (if indicated): not done  Assets:  Communication Skills Desire for Improvement Housing Intimacy Physical Health Social Support Vocational/Educational  ADL's:  Intact  Cognition: WNL  Sleep:  Good   Screenings: AIMS    Flowsheet Row [facility]  AIMS Total Score 0      AUDIT    Flowsheet Row [facility]  Alcohol Use Disorder Identification Test Final Score (AUDIT) 2      GAD-7    Flowsheet Row Video Visit from 01/09/2021 in [facility] Video Visit from 11/06/2020 in [facility] Video Visit from 09/04/2020 in [facility]  Total GAD-7 Score 2 4 6       PHQ2-9    Flowsheet Row Video Visit from 01/09/2021 in [facility] Video Visit from 11/06/2020 in [facility] Video Visit from 09/04/2020 in [facility]  PHQ-2 Total Score 4 3 0  PHQ-9 Total Score 12 13 --      Flowsheet Row Video Visit from 01/09/2021 in [facility] Video Visit from 11/06/2020 in [facility] Video Visit from 09/04/2020 in [facility]  C-SSRS RISK CATEGORY Low Risk Low Risk Low Risk  Assessment and Plan:   Krystal C. Scoggin "Marlene Bast" is a 20 year old, transgender female with a past psychiatric history significant for borderline personality disorder, PTSD, generalized anxiety disorder, and bipolar disorder who presents to [facility] via virtual video visit for follow-up and medication management.  Patient states that he has been experiencing decreased sex drive and weight gain through the use of his Lexapro and would like to switch his antidepressant.  Patient was recommended Wellbutrin 150 mg 24-hour tablet daily.  Patient was recommended taking Lexapro 10 mg daily for 3 days followed by 5 mg daily prior to discontinuing the medication.  Patient is agreeable to recommendation.  Patient's medication to be e-prescribed to pharmacy of choice.  1. Borderline personality disorder (HCC)  - lamoTRIgine (LAMICTAL) 100 MG tablet; Take 1 tablet (100 mg total) by mouth 2 (two) times daily.  Dispense: 60 tablet; Refill: 2  2. Bipolar affective disorder, remission status unspecified (HCC)  - escitalopram (LEXAPRO) 5 MG tablet; Take 1 tablet (5 mg total) by mouth daily. Patient to take 5 mg for 3 days, prior to taking 10 mg for 3 days.  Dispense: 3 tablet; Refill: 0 - QUEtiapine (SEROQUEL) 200 MG tablet; Take 1 tablet (200 mg total) by mouth at bedtime.  Dispense: 30 tablet; Refill: 2 - buPROPion (WELLBUTRIN XL) 150 MG 24 hr tablet; Take 1 tablet (150 mg total) by mouth every morning.  Dispense: 30 tablet; Refill: 1  3. PTSD  (post-traumatic stress disorder)  - escitalopram (LEXAPRO) 5 MG tablet; Take 1 tablet (5 mg total) by mouth daily. Patient to take 5 mg for 3 days, prior to taking 10 mg for 3 days.  Dispense: 3 tablet; Refill: 0  4. Generalized anxiety disorder  - escitalopram (LEXAPRO) 5 MG tablet; Take 1 tablet (5 mg total) by mouth daily. Patient to take 5 mg for 3 days, prior to taking 10 mg for 3 days.  Dispense: 3 tablet; Refill: 0  Patient to follow up in 2 months Provider spent a total of 20 minutes with the patient/reviewing patient's chart  Meta Hatchet, PA 01/09/2021, 1:13 PM

## 2021-02-01 DIAGNOSIS — Z79899 Other long term (current) drug therapy: Secondary | ICD-10-CM | POA: Diagnosis not present

## 2021-02-01 DIAGNOSIS — F649 Gender identity disorder, unspecified: Secondary | ICD-10-CM | POA: Diagnosis not present

## 2021-02-28 DIAGNOSIS — Z23 Encounter for immunization: Secondary | ICD-10-CM | POA: Diagnosis not present

## 2021-03-13 ENCOUNTER — Encounter (HOSPITAL_COMMUNITY): Payer: Self-pay | Admitting: Physician Assistant

## 2021-03-13 ENCOUNTER — Other Ambulatory Visit: Payer: Self-pay

## 2021-03-13 ENCOUNTER — Telehealth (INDEPENDENT_AMBULATORY_CARE_PROVIDER_SITE_OTHER): Payer: No Payment, Other | Admitting: Physician Assistant

## 2021-03-13 DIAGNOSIS — F603 Borderline personality disorder: Secondary | ICD-10-CM

## 2021-03-13 DIAGNOSIS — F319 Bipolar disorder, unspecified: Secondary | ICD-10-CM | POA: Diagnosis not present

## 2021-03-13 MED ORDER — BUPROPION HCL ER (XL) 150 MG PO TB24: 150.0000 mg | ORAL_TABLET | ORAL | 1 refills | Status: DC

## 2021-03-13 NOTE — Progress Notes (Signed)
BH MD/PA/NP OP Progress Note  Virtual Visit via Telephone Note  I connected with [name] on 03/16/21 at  3:00 PM EDT by telephone and verified that I am speaking with the correct person using two identifiers.  Location: Patient: Home Provider: Clinic   I discussed the limitations, risks, security and privacy concerns of performing an evaluation and management service by telephone and the availability of in person appointments. I also discussed with the patient that there may be a patient responsible charge related to this service. The patient expressed understanding and agreed to proceed.  Follow Up Instructions:  I discussed the assessment and treatment plan with the patient. The patient was provided an opportunity to ask questions and all were answered. The patient agreed with the plan and demonstrated an understanding of the instructions.   The patient was advised to call back or seek an in-person evaluation if the symptoms worsen or if the condition fails to improve as anticipated.  I provided 15 minutes of non-face-to-face time during this encounter.  Meta Hatchet, PA   03/13/2021 3:29 PM [name]  MRN:  [id number]  Chief Complaint: Follow up and medication management  HPI:   [name] "[name]" is a 20 year old, transgender female with a past psychiatric history significant for borderline personality disorder, PTSD, generalized anxiety disorder, and bipolar disorder who presents to [facility] for follow-up and medication management.  Patient is currently being managed on the following medications:  Bupropion (Wellbutrin XL) 150 mg 24-hour tablet daily Seroquel 200 mg at bedtime Lamotrigine 100 mg 2 times daily  Patient reports that his Wellbutrin is working better than the Lexapro he was previously on.  He does express that he feels that his social anxiety has increased some since being placed on Wellbutrin, however,  patient expresses that his elevated anxiety may be due to starting a new job recently.  Patient denies experiencing any depressive episodes.  He rates his anxiety a 5 out of 10.  Patient denies any new stressors at this time.  A GAD-7 screen was performed with the patient scoring to 6.  Patient is alert and oriented x4, pleasant, calm, cooperative, and fully engaged in conversation during the encounter.  Patient endorses good mood.  Patient denies suicidal or homicidal ideations.  He further denies auditory or visual hallucinations and does not appear to be responding to internal/external stimuli.  Patient endorses good sleep and receives on average 8-1/2 hours of sleep each night.  Patient endorses good appetite and eats on average 2 meals per day.  Patient denies alcohol consumption.  He further denies tobacco use but states that he does engage in vaping.  Patient endorses illicit drug use in the form of marijuana.  Visit Diagnosis:    ICD-10-CM   1. Bipolar affective disorder, remission status unspecified (HCC)  F31.9 buPROPion (WELLBUTRIN XL) 150 MG 24 hr tablet      Past Psychiatric History:  Borderline Personality Disorder Generalized Anxiety Disorder PTSD Bipolar Disorder  Past Medical History:  Past Medical History:  Diagnosis Date   Anxiety    Chondromalacia of knee    right   Depression    Patellar instability of right knee    Vision abnormalities    wears glasses    Past Surgical History:  Procedure Laterality Date   CYST EXCISION Left 12/25/1999   inclusion cyst forehead   KIDNEY SURGERY  02/2004   for reflux at age 35 at [facility]   KNEE ARTHROSCOPY WITH MEDIAL PATELLAR FEMORAL LIGAMENT RECONSTRUCTION Right 10/04/2013   Procedure: RIGHT ARTHROSCOPY KNEE, EXCISION OF LOOSE BODIES, CHONDROPLASTY , PATELLA FEMORAL LIGAMENT RECONSTRUCTION ;  Surgeon: Velna Ochs, MD;  Location: [facility];  Service: Orthopedics;  Laterality: Right;    TONSILLECTOMY  09/2007   [facility]    Family Psychiatric History:  Grandmother - Substance abuse Aunt - bipolar disorder  Family History: No family history on file.  Social History:  Social History   Socioeconomic History   Marital status: Single    Spouse name: Not on file   Number of children: Not on file   Years of education: 13 yrs    Highest education level: 12th grade  Occupational History   Occupation: Musician work  Tobacco Use   Smoking status: Some Days    Packs/day: 0.50    Types: E-cigarettes, Cigarettes   Smokeless tobacco: Never   Tobacco comments:    vapor approximately 5 yrs  Vaping Use   Vaping Use: Every day   Start date: 04/29/2014   Last attempt to quit: 04/29/2018   Devices: tobacco vap started 5 yrs ago  Substance and Sexual Activity   Alcohol use: Yes    Alcohol/week: 3.0 standard drinks    Types: 3 Shots of liquor per week    Comment: may drink 3 drink liquor every 3 months   Drug use: No   Sexual activity: Yes    Birth control/protection: Pill  Other Topics Concern   Not on file  Social History Narrative   Not on file   Social Determinants of Health   Financial Resource Strain: Not on file  Food Insecurity: Not on file  Transportation Needs: Not on file  Physical Activity: Not on file  Stress: Not on file  Social Connections: Not on file    Allergies: No Known Allergies  Metabolic Disorder Labs: Lab Results  Component Value Date   HGBA1C 4.7 (L) 05/01/2018   MPG 88.19 05/01/2018   Lab Results  Component Value Date   PROLACTIN 29.2 (H) 05/01/2018   Lab Results  Component Value Date   CHOL 225 (H) 05/01/2018   TRIG 173 (H) 05/01/2018   HDL 39 (L) 05/01/2018   CHOLHDL 5.8 05/01/2018   VLDL 35 05/01/2018   LDLCALC 151 (H) 05/01/2018   Lab Results  Component Value Date   TSH 1.491 05/01/2018    Therapeutic Level Labs: No results found for: LITHIUM No results found for: VALPROATE No components found  for:  CBMZ  Current Medications: Current Outpatient Medications  Medication Sig Dispense Refill   buPROPion (WELLBUTRIN XL) 150 MG 24 hr tablet Take 1 tablet (150 mg total) by mouth every morning. 30 tablet 1   escitalopram (LEXAPRO) 5 MG tablet Take 1 tablet (5 mg total) by mouth daily. Patient to take 5 mg for 3 days, prior to taking 10 mg for 3 days. 3 tablet 0   fluticasone (FLONASE) 50 MCG/ACT nasal spray Place 2 sprays into both nostrils daily. For allergies  0   hydrOXYzine (ATARAX/VISTARIL) 50 MG tablet Take 1 tablet (50 mg total) by mouth every 6 (six) hours as needed for anxiety. 60 tablet 0   lamoTRIgine (LAMICTAL) 100 MG tablet Take 1 tablet (100 mg total) by mouth 2 (two) times daily. 60 tablet 2   loratadine (CLARITIN) 10 MG tablet Take 1 tablet (10 mg total) by mouth daily. (May buy from over the counter): For allergies  nicotine (NICODERM CQ - DOSED IN MG/24 HOURS) 21 mg/24hr patch Place 1 patch (21 mg total) onto the skin daily. (May buy from over the counter): For smoking cessation 28 patch 0   QUEtiapine (SEROQUEL) 200 MG tablet Take 1 tablet (200 mg total) by mouth at bedtime. 30 tablet 2   No current facility-administered medications for this visit.     Musculoskeletal: Strength & Muscle Tone: Unable to assess due to telemedicine visit Gait & Station: Unable to assess due to telemedicine visit Patient leans: Unable to assess due to telemedicine visit  Psychiatric Specialty Exam: Review of Systems  Psychiatric/Behavioral:  Negative for decreased concentration, dysphoric mood, hallucinations, self-injury, sleep disturbance and suicidal ideas. The patient is nervous/anxious. The patient is not hyperactive.    There were no vitals taken for this visit.There is no height or weight on file to calculate BMI.  General Appearance: Unable to assess due to telemedicine visit  Eye Contact:  Unable to assess due to telemedicine visit  Speech:  Clear and Coherent and Normal  Rate  Volume:  Normal  Mood:  Anxious and Euthymic  Affect:  Appropriate and Congruent  Thought Process:  Coherent, Goal Directed, and Descriptions of Associations: Intact  Orientation:  Full (Time, Place, and Person)  Thought Content: WDL   Suicidal Thoughts:  No  Homicidal Thoughts:  No  Memory:  Immediate;   Good Recent;   Good Remote;   Good  Judgement:  Good  Insight:  Good  Psychomotor Activity:  Normal  Concentration:  Concentration: Good and Attention Span: Good  Recall:  Good  Fund of Knowledge: Good  Language: Good  Akathisia:  NA  Handed:  Right  AIMS (if indicated): not done  Assets:  Communication Skills Desire for Improvement Housing Intimacy Physical Health Social Support Vocational/Educational  ADL's:  Intact  Cognition: WNL  Sleep:  Good   Screenings: AIMS    Flowsheet Row [facility]  AIMS Total Score 0      AUDIT    Flowsheet Row Ad[facility] ([facility]  Alcohol Use Disorder Identification Test Final Score (AUDIT) 2      GAD-7    Flowsheet Row Video Visit from 03/13/2021 in [facility] Video Visit from 01/09/2021 in [facility] Video Visit from 11/06/2020 in [facility] Video Visit from 09/04/2020 in [facility]  Total GAD-7 Score 6 2 4 6       PHQ2-9    Flowsheet Row Video Visit from 03/13/2021 in [facility] Video Visit from 01/09/2021 in [facility] Video Visit from 11/06/2020 in [facility] Video Visit from 09/04/2020 in [facility]  PHQ-2 Total Score 0 4 3 0  PHQ-9 Total Score -- 12 13 --      Flowsheet Row Video Visit from 03/13/2021 in [facility]  Video Visit from 01/09/2021 in [facility] Video Visit from 11/06/2020 in [facility]  C-SSRS RISK CATEGORY Low Risk Low Risk Low Risk        Assessment and Plan:   Fatiha C. Colee "[facility]" is a 20 year old, transgender female with a past psychiatric history significant for borderline personality disorder, PTSD, generalized anxiety disorder, and bipolar disorder who presents to [facility] for  follow-up and medication management.  Patient reports that he has been doing well on his Wellbutrin and reports no major issues.  Patient is requesting refills on all his medications following the conclusion of the encounter.  Patient's medications to be e-prescribed to pharmacy of choice.  1. Bipolar affective disorder, remission status unspecified (HCC) Patient to continue taking Quetiapine 200 mg at bedtime for the management of her bipolar disorder  - buPROPion (WELLBUTRIN XL) 150 MG 24 hr tablet; Take 1 tablet (150 mg total) by mouth every morning.  Dispense: 30 tablet; Refill: 1  2. Borderline personality disorder (HCC) Patient to continue taking Lamotrigine 100 mg two times daily for the management of her borderline personality disorder  Patient to follow up in 3 months Provider spent a total of 15 minutes with the patient/reviewing patient's chart  Meta Hatchet, PA 03/13/2021, 3:29 PM

## 2021-04-15 ENCOUNTER — Other Ambulatory Visit (HOSPITAL_COMMUNITY): Payer: Self-pay | Admitting: Physician Assistant

## 2021-04-15 DIAGNOSIS — F603 Borderline personality disorder: Secondary | ICD-10-CM

## 2021-05-17 ENCOUNTER — Other Ambulatory Visit (HOSPITAL_COMMUNITY): Payer: Self-pay | Admitting: Physician Assistant

## 2021-05-17 DIAGNOSIS — F319 Bipolar disorder, unspecified: Secondary | ICD-10-CM

## 2021-06-05 ENCOUNTER — Telehealth (INDEPENDENT_AMBULATORY_CARE_PROVIDER_SITE_OTHER): Payer: BC Managed Care – PPO | Admitting: Physician Assistant

## 2021-06-05 ENCOUNTER — Encounter (HOSPITAL_COMMUNITY): Payer: Self-pay | Admitting: Physician Assistant

## 2021-06-05 DIAGNOSIS — F319 Bipolar disorder, unspecified: Secondary | ICD-10-CM

## 2021-06-05 DIAGNOSIS — F603 Borderline personality disorder: Secondary | ICD-10-CM | POA: Diagnosis not present

## 2021-06-05 DIAGNOSIS — F411 Generalized anxiety disorder: Secondary | ICD-10-CM | POA: Diagnosis not present

## 2021-06-05 MED ORDER — BUSPIRONE HCL 7.5 MG PO TABS: 7.5000 mg | ORAL_TABLET | Freq: Two times a day (BID) | ORAL | 1 refills | Status: DC

## 2021-06-05 MED ORDER — BUPROPION HCL ER (XL) 150 MG PO TB24: 150.0000 mg | ORAL_TABLET | ORAL | 1 refills | Status: DC

## 2021-06-05 MED ORDER — LAMOTRIGINE 100 MG PO TABS: 100.0000 mg | ORAL_TABLET | Freq: Two times a day (BID) | ORAL | 1 refills | Status: DC

## 2021-06-05 NOTE — Progress Notes (Addendum)
BH MD/PA/NP OP Progress Note  Virtual Visit via Video Note  I connected with [name] on 06/05/21 at 11:00 AM EST by a video enabled telemedicine application and verified that I am speaking with the correct person using two identifiers.  Location: Patient: Home Provider: Clinic   I discussed the limitations of evaluation and management by telemedicine and the availability of in person appointments. The patient expressed understanding and agreed to proceed.  Follow Up Instructions:  I discussed the assessment and treatment plan with the patient. The patient was provided an opportunity to ask questions and all were answered. The patient agreed with the plan and demonstrated an understanding of the instructions.   The patient was advised to call back or seek an in-person evaluation if the symptoms worsen or if the condition fails to improve as anticipated.  I provided 16 minutes of non-face-to-face time during this encounter.  Meta Hatchet, PA   06/05/2021 11:22 PM [name]  MRN:  [id number]  Chief Complaint: Follow up and medication management  HPI:   [name] "Marlene Bast" is a 20 year old, transgender female with a past psychiatric history significant for borderline personality disorder, PTSD, generalized anxiety disorder, and bipolar disorder who presents to [facility] behavioral health outpatient clinic for follow-up and medication management.  Patient is currently being managed on the following medications:  Bupropion (Wellbutrin XL) 150 mg 24-hour tablet daily Seroquel 200 mg at bedtime Lamictal 100 mg 2 times daily  Patient reports that her anxiety has gotten worse since the last encounter.  They reports that they plan on moving to campus and are concerned that their anxiety will continue to get worse.  Patient is requesting a letter for admission for the use of emotional support animal.  Provider to grafts permission stating that emotional support animal is  to be allowed to live with them while on campus.  Patient reports that the depression is fairly managed and denies experiencing any major symptoms.  Patient states that there are anxiety is currently minimal but endorses elevated anxiety when at work.  While at work, patient rates her there anxiety a 6 out of 10.  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  Patient denies any other stressors at this time.  A PHQ-9 screen was performed with the patient scoring a 6.  A GAD-7 screen was also performed with the patient scoring an 8.  Patient is alert and oriented x4, pleasant, calm, cooperative, and fully engaged in conversation during the encounter.  Patient endorses good mood.  Patient denies suicidal or homicidal ideations.  They further deny auditory or visual hallucinations and does not appear to be responding to internal/external stimuli.  Patient endorses good sleep and receives on average 9 hours of sleep each night.  Patient endorses decreased appetite and eats on average 2 meals a day.  Patient denies alcohol consumption.  Patient tobacco use but does engage in vaping.  Patient endorses illicit drug use in the form of marijuana.  Visit Diagnosis:    ICD-10-CM   1. Generalized anxiety disorder  F41.1 busPIRone (BUSPAR) 7.5 MG tablet    2. Borderline personality disorder (HCC)  F60.3 lamoTRIgine (LAMICTAL) 100 MG tablet    3. Bipolar affective disorder, remission status unspecified (HCC)  F31.9 buPROPion (WELLBUTRIN XL) 150 MG 24 hr tablet      Past Psychiatric History:  Borderline Personality Disorder Generalized Anxiety Disorder PTSD Bipolar Disorder  Past Medical History:  Past Medical History:  Diagnosis Date   Anxiety    Chondromalacia of knee    right   Depression    Patellar instability of right knee    Vision abnormalities    wears glasses    Past Surgical History:  Procedure Laterality Date   CYST EXCISION Left 12/25/1999   inclusion cyst  forehead   KIDNEY SURGERY  02/2004   for reflux at age 25 at [facility]   KNEE ARTHROSCOPY WITH MEDIAL PATELLAR FEMORAL LIGAMENT RECONSTRUCTION Right 10/04/2013   Procedure: RIGHT ARTHROSCOPY KNEE, EXCISION OF LOOSE BODIES, CHONDROPLASTY , PATELLA FEMORAL LIGAMENT RECONSTRUCTION ;  Surgeon: Velna Ochs, MD;  Location: [facility];  Service: Orthopedics;  Laterality: Right;   TONSILLECTOMY  09/2007   [facility]    Family Psychiatric History:  Grandmother - Substance abuse Aunt - bipolar disorder  Family History: History reviewed. No pertinent family history.  Social History:  Social History   Socioeconomic History   Marital status: Single    Spouse name: Not on file   Number of children: Not on file   Years of education: 13 yrs    Highest education level: 12th grade  Occupational History   Occupation: Musician work  Tobacco Use   Smoking status: Some Days    Packs/day: 0.50    Types: E-cigarettes, Cigarettes   Smokeless tobacco: Never   Tobacco comments:    vapor approximately 5 yrs  Vaping Use   Vaping Use: Every day   Start date: 04/29/2014   Last attempt to quit: 04/29/2018   Devices: tobacco vap started 5 yrs ago  Substance and Sexual Activity   Alcohol use: Yes    Alcohol/week: 3.0 standard drinks    Types: 3 Shots of liquor per week    Comment: may drink 3 drink liquor every 3 months   Drug use: No   Sexual activity: Yes    Birth control/protection: Pill  Other Topics Concern   Not on file  Social History Narrative   Not on file   Social Determinants of Health   Financial Resource Strain: Not on file  Food Insecurity: Not on file  Transportation Needs: Not on file  Physical Activity: Not on file  Stress: Not on file  Social Connections: Not on file    Allergies: No Known Allergies  Metabolic Disorder Labs: Lab Results  Component Value Date   HGBA1C 4.7 (L) 05/01/2018   MPG 88.19 05/01/2018   Lab  Results  Component Value Date   PROLACTIN 29.2 (H) 05/01/2018   Lab Results  Component Value Date   CHOL 225 (H) 05/01/2018   TRIG 173 (H) 05/01/2018   HDL 39 (L) 05/01/2018   CHOLHDL 5.8 05/01/2018   VLDL 35 05/01/2018   LDLCALC 151 (H) 05/01/2018   Lab Results  Component Value Date   TSH 1.491 05/01/2018    Therapeutic Level Labs: No results found for: LITHIUM No results found for: VALPROATE No components found for:  CBMZ  Current Medications: Current Outpatient Medications  Medication Sig Dispense Refill   busPIRone (BUSPAR) 7.5 MG tablet Take 1 tablet (7.5 mg total) by mouth in the morning and at bedtime. 60 tablet 1   buPROPion (WELLBUTRIN XL) 150 MG 24 hr tablet Take 1 tablet (150 mg total) by mouth every morning. 30 tablet 1   escitalopram (LEXAPRO) 5 MG tablet Take 1 tablet (5 mg total) by mouth daily. Patient to take 5 mg for 3 days, prior to taking 10 mg for  3 days. 3 tablet 0   fluticasone (FLONASE) 50 MCG/ACT nasal spray Place 2 sprays into both nostrils daily. For allergies  0   hydrOXYzine (ATARAX/VISTARIL) 50 MG tablet Take 1 tablet (50 mg total) by mouth every 6 (six) hours as needed for anxiety. 60 tablet 0   lamoTRIgine (LAMICTAL) 100 MG tablet Take 1 tablet (100 mg total) by mouth 2 (two) times daily. 60 tablet 1   loratadine (CLARITIN) 10 MG tablet Take 1 tablet (10 mg total) by mouth daily. (May buy from over the counter): For allergies     nicotine (NICODERM CQ - DOSED IN MG/24 HOURS) 21 mg/24hr patch Place 1 patch (21 mg total) onto the skin daily. (May buy from over the counter): For smoking cessation 28 patch 0   QUEtiapine (SEROQUEL) 200 MG tablet TAKE ONE TABLET BY MOUTH EVERY NIGHT AT BEDTIME 30 tablet 2   No current facility-administered medications for this visit.     Musculoskeletal: Strength & Muscle Tone: Unable to assess due to telemedicine visit Gait & Station: Unable to assess due to telemedicine visit Patient leans: Unable to assess due  to telemedicine visit  Psychiatric Specialty Exam: Review of Systems  Psychiatric/Behavioral:  Negative for decreased concentration, dysphoric mood, hallucinations, self-injury, sleep disturbance and suicidal ideas. The patient is nervous/anxious. The patient is not hyperactive.    There were no vitals taken for this visit.There is no height or weight on file to calculate BMI.  General Appearance: Well Groomed  Eye Contact:  Good  Speech:  Clear and Coherent and Normal Rate  Volume:  Normal  Mood:  Anxious and Euthymic  Affect:  Appropriate and Congruent  Thought Process:  Coherent, Goal Directed, and Descriptions of Associations: Intact  Orientation:  Full (Time, Place, and Person)  Thought Content: WDL   Suicidal Thoughts:  No  Homicidal Thoughts:  No  Memory:  Immediate;   Good Recent;   Good Remote;   Good  Judgement:  Good  Insight:  Good  Psychomotor Activity:  Normal  Concentration:  Concentration: Good and Attention Span: Good  Recall:  Good  Fund of Knowledge: Good  Language: Good  Akathisia:  NA  Handed:  Right  AIMS (if indicated): not done  Assets:  Communication Skills Desire for Improvement Housing Intimacy Physical Health Social Support Vocational/Educational  ADL's:  Intact  Cognition: WNL  Sleep:  Good   Screenings: AIMS    Flowsheet Row [facility]  AIMS Total Score 0      AUDIT    Flowsheet Row [facility]  Alcohol Use Disorder Identification Test Final Score (AUDIT) 2      GAD-7    Flowsheet Row Video Visit from 06/05/2021 in [facility] Video Visit from 03/13/2021 in [facility] Video Visit from 01/09/2021 in [facility] Video Visit from 11/06/2020 in [facility] Video Visit from  09/04/2020 in [facility]  Total GAD-7 Score 8 6 2 4 6       PHQ2-9    Flowsheet Row Video Visit from 06/05/2021 in [facility] Video Visit from 03/13/2021 in [facility] Video Visit from 01/09/2021 in [facility] Video Visit from 11/06/2020 in [facility] Video Visit from 09/04/2020 in [facility]  PHQ-2  Total Score 2 0 4 3 0  PHQ-9 Total Score 6 -- 12 13 --      Flowsheet Row Video Visit from 06/05/2021 in [facility] Video Visit from 03/13/2021 in [facility] Video Visit from 01/09/2021 in [facility]  C-SSRS RISK CATEGORY Low Risk Low Risk Low Risk        Assessment and Plan:   Sussan C. [name] "Marlene Bast" is a 20 year old, transgender female with a past psychiatric history significant for borderline personality disorder, PTSD, generalized anxiety disorder, and bipolar disorder who presents to [facility] behavioral health outpatient clinic for follow-up and medication management.  Patient endorses anxiety related to moving back onto campus and working.  Patient is requesting permission for an emotional support animal to help alleviate their anxiety when moving back onto campus.  Provider to draft a letter granting permission for the use of an emotional support animal.  Provider also recommended patient be placed on buspirone 7.5 mg 2 times daily for the management of their anxiety.  Patient was agreeable to recommendation.  Patient's medications to be e-prescribed to pharmacy of choice.  1. Borderline personality disorder (HCC)  - lamoTRIgine (LAMICTAL) 100 MG tablet; Take 1 tablet (100 mg total) by mouth 2 (two) times daily.  Dispense: 60 tablet; Refill: 1  2. Bipolar affective disorder, remission status unspecified (HCC) Patient to  continue taking Quetiapine 200 mg at bedtime for the management of her bipolar disorder  - buPROPion (WELLBUTRIN XL) 150 MG 24 hr tablet; Take 1 tablet (150 mg total) by mouth every morning.  Dispense: 30 tablet; Refill: 1  3. Generalized anxiety disorder  - busPIRone (BUSPAR) 7.5 MG tablet; Take 1 tablet (7.5 mg total) by mouth in the morning and at bedtime.  Dispense: 60 tablet; Refill: 1  Patient to follow up in 2 months Provider spent a total of 16 minutes with the patient/reviewing patient's chart  Meta Hatchet, PA 06/05/2021, 11:22 PM

## 2021-06-26 ENCOUNTER — Encounter (HOSPITAL_COMMUNITY): Payer: Self-pay | Admitting: Physician Assistant

## 2021-08-07 ENCOUNTER — Encounter (HOSPITAL_COMMUNITY): Payer: Self-pay | Admitting: Physician Assistant

## 2021-08-07 ENCOUNTER — Telehealth (INDEPENDENT_AMBULATORY_CARE_PROVIDER_SITE_OTHER): Payer: BC Managed Care – PPO | Admitting: Physician Assistant

## 2021-08-07 ENCOUNTER — Other Ambulatory Visit: Payer: Self-pay

## 2021-08-07 DIAGNOSIS — F603 Borderline personality disorder: Secondary | ICD-10-CM

## 2021-08-07 DIAGNOSIS — F319 Bipolar disorder, unspecified: Secondary | ICD-10-CM | POA: Diagnosis not present

## 2021-08-07 MED ORDER — LAMOTRIGINE 100 MG PO TABS: 100.0000 mg | ORAL_TABLET | Freq: Two times a day (BID) | ORAL | 3 refills | Status: DC

## 2021-08-07 MED ORDER — BUPROPION HCL ER (XL) 150 MG PO TB24: 150.0000 mg | ORAL_TABLET | ORAL | 3 refills | Status: DC

## 2021-08-07 MED ORDER — QUETIAPINE FUMARATE 200 MG PO TABS: 200.0000 mg | ORAL_TABLET | Freq: Every day | ORAL | 3 refills | Status: DC

## 2021-08-07 NOTE — Progress Notes (Signed)
BH MD/PA/NP OP Progress Note  Virtual Visit via Video Note  I connected with [name] on 08/07/21 at 11:00 AM EST by a video enabled telemedicine application and verified that I am speaking with the correct person using two identifiers.  Location: Patient: Home Provider: Clinic   I discussed the limitations of evaluation and management by telemedicine and the availability of in person appointments. The patient expressed understanding and agreed to proceed.  Follow Up Instructions:   I discussed the assessment and treatment plan with the patient. The patient was provided an opportunity to ask questions and all were answered. The patient agreed with the plan and demonstrated an understanding of the instructions.   The patient was advised to call back or seek an in-person evaluation if the symptoms worsen or if the condition fails to improve as anticipated.  I provided 14 minutes of non-face-to-face time during this encounter.  Malachy Mood, PA    08/07/2021 10:27 PM [name]  MRN:  [id number]  Chief Complaint: Follow up and medication management   HPI:   [name] "[name]" is a 21 year old transgender female with a past psychiatric history significant for borderline personality disorder, PTSD, generalized anxiety disorder, and bipolar disorder who presents to [facility] via virtual video visit for follow-up and medication management.  Patient is currently being managed on the following medications:  Bupropion (Wellbutrin XL) 150 mg 24-hour tablet daily Seroquel 200 mg at bedtime Lamictal 100 mg 2 times daily  Patient reports no issues or concerns regarding their current medication regimen.  Patient denies the need for dosage adjustments at this time and is requesting refills on all of their medications following the conclusion of the encounter.  Patient reports that they are taking their medications as prescribed and are  experiencing no severe adverse side effects.  Patient endorses some mild depression characterized by the following symptoms: fatigue and weepiness.  Patient attributes her symptoms to recently being diagnosed with COVID.  They report that they are still recovering from symptoms but feel much better.  Patient endorses mild anxiety and rate their anxiety a 3 out of 10.  Patient stressors include school and having a heavy workload this semester.  A PHQ-9 screen was performed with the patient scoring a 6.  A GAD-7 screen was also performed with the patient scoring an 8.  Patient is alert and oriented x4, calm, cooperative, and fully engaged in conversation during the encounter.  Patient endorses neutral mood.  Patient denies suicidal or homicidal ideations.  They further deny auditory or visual hallucinations and do not appear to be responding to internal/external stimuli.  Patient endorses good sleep and receives on average 8 to 9 hours of sleep each night.  Patient endorses good appetite and eats on average 2 meals per day.  Patient denies alcohol consumption.  They further deny tobacco use but do engage in vaping every day.  Patient endorses illicit drug use in the form of cannabis.  Visit Diagnosis:    ICD-10-CM   1. Bipolar affective disorder, remission status unspecified (HCC)  F31.9 QUEtiapine (SEROQUEL) 200 MG tablet    buPROPion (WELLBUTRIN XL) 150 MG 24 hr tablet    2. Borderline personality disorder (HCC)  F60.3 lamoTRIgine (LAMICTAL) 100 MG tablet      Past Psychiatric History:  Borderline Personality Disorder Generalized Anxiety Disorder PTSD Bipolar Disorder  Past Medical History:  Past Medical History:  Diagnosis Date   Anxiety  Chondromalacia of knee    right   Depression    Patellar instability of right knee    Vision abnormalities    wears glasses    Past Surgical History:  Procedure Laterality Date   CYST EXCISION Left 12/25/1999   inclusion cyst forehead   KIDNEY  SURGERY  02/2004   for reflux at age 38 at [place] Right 10/04/2013   Procedure: RIGHT ARTHROSCOPY KNEE, EXCISION OF LOOSE BODIES, CHONDROPLASTY , PATELLA FEMORAL LIGAMENT RECONSTRUCTION ;  Surgeon: Hessie Dibble, MD;  Location: [place];  Service: Orthopedics;  Laterality: Right;   TONSILLECTOMY  09/2007   [facility]    Family Psychiatric History:  Grandmother - Substance abuse Aunt - bipolar disorder  Family History: History reviewed. No pertinent family history.  Social History:  Social History   Socioeconomic History   Marital status: Single    Spouse name: Not on file   Number of children: Not on file   Years of education: 13 yrs    Highest education level: 12th grade  Occupational History   Occupation: Chiropractor work  Tobacco Use   Smoking status: Some Days    Packs/day: 0.50    Types: E-cigarettes, Cigarettes   Smokeless tobacco: Never   Tobacco comments:    vapor approximately 5 yrs  Vaping Use   Vaping Use: Every day   Start date: 04/29/2014   Last attempt to quit: 04/29/2018   Devices: tobacco vap started 5 yrs ago  Substance and Sexual Activity   Alcohol use: Yes    Alcohol/week: 3.0 standard drinks    Types: 3 Shots of liquor per week    Comment: may drink 3 drink liquor every 3 months   Drug use: No   Sexual activity: Yes    Birth control/protection: Pill  Other Topics Concern   Not on file  Social History Narrative   Not on file   Social Determinants of Health   Financial Resource Strain: Not on file  Food Insecurity: Not on file  Transportation Needs: Not on file  Physical Activity: Not on file  Stress: Not on file  Social Connections: Not on file    Allergies: No Known Allergies  Metabolic Disorder Labs: Lab Results  Component Value Date   HGBA1C 4.7 (L) 05/01/2018   MPG 88.19 05/01/2018   Lab Results  Component  Value Date   PROLACTIN 29.2 (H) 05/01/2018   Lab Results  Component Value Date   CHOL 225 (H) 05/01/2018   TRIG 173 (H) 05/01/2018   HDL 39 (L) 05/01/2018   CHOLHDL 5.8 05/01/2018   VLDL 35 05/01/2018   LDLCALC 151 (H) 05/01/2018   Lab Results  Component Value Date   TSH 1.491 05/01/2018    Therapeutic Level Labs: No results found for: LITHIUM No results found for: VALPROATE No components found for:  CBMZ  Current Medications: Current Outpatient Medications  Medication Sig Dispense Refill   buPROPion (WELLBUTRIN XL) 150 MG 24 hr tablet Take 1 tablet (150 mg total) by mouth every morning. 30 tablet 3   busPIRone (BUSPAR) 7.5 MG tablet Take 1 tablet (7.5 mg total) by mouth in the morning and at bedtime. 60 tablet 1   escitalopram (LEXAPRO) 5 MG tablet Take 1 tablet (5 mg total) by mouth daily. Patient to take 5 mg for 3 days, prior to taking 10 mg for 3 days. 3 tablet 0   fluticasone (  FLONASE) 50 MCG/ACT nasal spray Place 2 sprays into both nostrils daily. For allergies  0   hydrOXYzine (ATARAX/VISTARIL) 50 MG tablet Take 1 tablet (50 mg total) by mouth every 6 (six) hours as needed for anxiety. 60 tablet 0   lamoTRIgine (LAMICTAL) 100 MG tablet Take 1 tablet (100 mg total) by mouth 2 (two) times daily. 60 tablet 3   loratadine (CLARITIN) 10 MG tablet Take 1 tablet (10 mg total) by mouth daily. (May buy from over the counter): For allergies     nicotine (NICODERM CQ - DOSED IN MG/24 HOURS) 21 mg/24hr patch Place 1 patch (21 mg total) onto the skin daily. (May buy from over the counter): For smoking cessation 28 patch 0   QUEtiapine (SEROQUEL) 200 MG tablet Take 1 tablet (200 mg total) by mouth at bedtime. 30 tablet 3   No current facility-administered medications for this visit.     Musculoskeletal: Strength & Muscle Tone: Unable to assess due to telemedicine visit [place]: Unable to assess due to telemedicine visit Patient leans: Unable to assess due to telemedicine  visit  Psychiatric Specialty Exam: Review of Systems  Psychiatric/Behavioral:  Negative for decreased concentration, dysphoric mood, hallucinations, self-injury, sleep disturbance and suicidal ideas. The patient is not nervous/anxious and is not hyperactive.    There were no vitals taken for this visit.There is no height or weight on file to calculate BMI.  General Appearance: Casual  Eye Contact:  Good  Speech:  Clear and Coherent and Normal Rate  Volume:  Normal  Mood:  Euthymic  Affect:  Appropriate  Thought Process:  Coherent and Descriptions of Associations: Intact  Orientation:  Full (Time, Place, and Person)  Thought Content: WDL   Suicidal Thoughts:  No  Homicidal Thoughts:  No  Memory:  Immediate;   Good Recent;   Good Remote;   Good  Judgement:  Good  Insight:  Good  Psychomotor Activity:  Normal  Concentration:  Concentration: Good and Attention Span: Good  Recall:  Good  Fund of Knowledge: Good  Language: Good  Akathisia:  No  Handed:  Right  AIMS (if indicated): not done  Assets:  Communication Skills Desire for Improvement Housing Intimacy Physical Health Social Support Vocational/Educational  ADL's:  Intact  Cognition: WNL  Sleep:  Good   Screenings: AIMS    Flowsheet Row Admission (Discharged) from 04/29/2018 in [place] [facility]  AIMS Total Score 0      AUDIT    Flowsheet Row Admission (Discharged) from 04/29/2018 in [place] [facility]  Alcohol Use Disorder Identification Test Final Score (AUDIT) 2      GAD-7    Flowsheet Row Video Visit from 08/07/2021 in [facility] Video Visit from 06/05/2021 in [facility] Video Visit from 03/13/2021 in [facility] Video Visit from 01/09/2021 in [facility] Video Visit from 11/06/2020 in [facility]  Total GAD-7  Score 5 8 6 2 4       PHQ2-9    Flowsheet Row Video Visit from 08/07/2021 in [facility] Video Visit from 06/05/2021 in [facility] Video Visit from 03/13/2021 in [facility] Video Visit from 01/09/2021 in [facility] Video Visit from 11/06/2020 in [facility]  PHQ-2 Total Score 2 2 0 4 3  PHQ-9 Total Score 6 6 --  [place] Video Visit from 08/07/2021 in [facility] Video Visit from 06/05/2021 in [facility] Video Visit from 03/13/2021 in [place] No Risk Low Risk Low Risk        Assessment and Plan:   Krystal C. Bartosik "[name]" is a 21 year old transgender female with a past psychiatric history significant for borderline personality disorder, PTSD, generalized anxiety disorder, and bipolar disorder who presents to [facility] via virtual video visit for follow-up and medication management.  Patient reports no issues or concerns regarding their current medication regimen.  Patient endorses mild depressive symptoms due to recent COVID diagnosis and states that their anxiety has been minimal at this time.  Patient denies the need for dosage adjustments at this time and is requesting refills on all of their medications at this time.  Patient's medications to be e-prescribed to pharmacy of choice.  1. Bipolar affective disorder, remission status unspecified (HCC)  - QUEtiapine (SEROQUEL) 200 MG tablet; Take 1 tablet (200 mg total) by mouth at bedtime.  Dispense: 30 tablet; Refill: 3 - buPROPion (WELLBUTRIN XL) 150 MG 24 hr tablet; Take 1 tablet (150 mg total) by mouth every morning.  Dispense: 30 tablet; Refill: 3  2. Borderline personality disorder ([place])  - lamoTRIgine (LAMICTAL) 100 MG tablet; Take  1 tablet (100 mg total) by mouth 2 (two) times daily.  Dispense: 60 tablet; Refill: 3  Patient to follow up in 3 months Provider spent a total of 14 minutes with the patient/reviewing patient's chart  Malachy Mood, PA 08/07/2021, 10:27 PM

## 2021-08-14 ENCOUNTER — Other Ambulatory Visit (HOSPITAL_COMMUNITY): Payer: Self-pay | Admitting: Physician Assistant

## 2021-08-14 DIAGNOSIS — F411 Generalized anxiety disorder: Secondary | ICD-10-CM

## 2021-09-04 DIAGNOSIS — F3174 Bipolar disorder, in full remission, most recent episode manic: Secondary | ICD-10-CM | POA: Diagnosis not present

## 2021-09-04 DIAGNOSIS — F411 Generalized anxiety disorder: Secondary | ICD-10-CM | POA: Diagnosis not present

## 2021-09-04 DIAGNOSIS — F603 Borderline personality disorder: Secondary | ICD-10-CM | POA: Diagnosis not present

## 2021-09-11 DIAGNOSIS — F3174 Bipolar disorder, in full remission, most recent episode manic: Secondary | ICD-10-CM | POA: Diagnosis not present

## 2021-09-11 DIAGNOSIS — F411 Generalized anxiety disorder: Secondary | ICD-10-CM | POA: Diagnosis not present

## 2021-09-11 DIAGNOSIS — F603 Borderline personality disorder: Secondary | ICD-10-CM | POA: Diagnosis not present

## 2021-09-17 ENCOUNTER — Telehealth (HOSPITAL_COMMUNITY): Payer: Self-pay | Admitting: Physician Assistant

## 2021-09-17 NOTE — Telephone Encounter (Signed)
Patient is calling to speak with provider regardin medications. They are making him "feel weird". Patient would like provider to call him at earliest convenience.  ?

## 2021-09-18 DIAGNOSIS — F603 Borderline personality disorder: Secondary | ICD-10-CM | POA: Diagnosis not present

## 2021-09-18 DIAGNOSIS — F3174 Bipolar disorder, in full remission, most recent episode manic: Secondary | ICD-10-CM | POA: Diagnosis not present

## 2021-09-18 DIAGNOSIS — F411 Generalized anxiety disorder: Secondary | ICD-10-CM | POA: Diagnosis not present

## 2021-09-25 DIAGNOSIS — F3174 Bipolar disorder, in full remission, most recent episode manic: Secondary | ICD-10-CM | POA: Diagnosis not present

## 2021-09-25 DIAGNOSIS — F603 Borderline personality disorder: Secondary | ICD-10-CM | POA: Diagnosis not present

## 2021-09-25 DIAGNOSIS — F411 Generalized anxiety disorder: Secondary | ICD-10-CM | POA: Diagnosis not present

## 2021-10-02 DIAGNOSIS — F603 Borderline personality disorder: Secondary | ICD-10-CM | POA: Diagnosis not present

## 2021-10-02 DIAGNOSIS — F3174 Bipolar disorder, in full remission, most recent episode manic: Secondary | ICD-10-CM | POA: Diagnosis not present

## 2021-10-02 DIAGNOSIS — F411 Generalized anxiety disorder: Secondary | ICD-10-CM | POA: Diagnosis not present

## 2021-10-09 DIAGNOSIS — F3174 Bipolar disorder, in full remission, most recent episode manic: Secondary | ICD-10-CM | POA: Diagnosis not present

## 2021-10-09 DIAGNOSIS — F603 Borderline personality disorder: Secondary | ICD-10-CM | POA: Diagnosis not present

## 2021-10-09 DIAGNOSIS — F411 Generalized anxiety disorder: Secondary | ICD-10-CM | POA: Diagnosis not present

## 2021-10-18 DIAGNOSIS — F411 Generalized anxiety disorder: Secondary | ICD-10-CM | POA: Diagnosis not present

## 2021-10-18 DIAGNOSIS — F3174 Bipolar disorder, in full remission, most recent episode manic: Secondary | ICD-10-CM | POA: Diagnosis not present

## 2021-10-18 DIAGNOSIS — F603 Borderline personality disorder: Secondary | ICD-10-CM | POA: Diagnosis not present

## 2021-10-23 DIAGNOSIS — F3174 Bipolar disorder, in full remission, most recent episode manic: Secondary | ICD-10-CM | POA: Diagnosis not present

## 2021-10-23 DIAGNOSIS — F603 Borderline personality disorder: Secondary | ICD-10-CM | POA: Diagnosis not present

## 2021-10-23 DIAGNOSIS — F411 Generalized anxiety disorder: Secondary | ICD-10-CM | POA: Diagnosis not present

## 2021-10-26 ENCOUNTER — Other Ambulatory Visit (HOSPITAL_COMMUNITY): Payer: Self-pay | Admitting: Physician Assistant

## 2021-10-26 DIAGNOSIS — F411 Generalized anxiety disorder: Secondary | ICD-10-CM

## 2021-10-31 DIAGNOSIS — F411 Generalized anxiety disorder: Secondary | ICD-10-CM | POA: Diagnosis not present

## 2021-10-31 DIAGNOSIS — F603 Borderline personality disorder: Secondary | ICD-10-CM | POA: Diagnosis not present

## 2021-10-31 DIAGNOSIS — F3174 Bipolar disorder, in full remission, most recent episode manic: Secondary | ICD-10-CM | POA: Diagnosis not present

## 2021-11-06 ENCOUNTER — Telehealth (HOSPITAL_COMMUNITY): Payer: BC Managed Care – PPO | Admitting: Physician Assistant

## 2021-11-06 DIAGNOSIS — F3174 Bipolar disorder, in full remission, most recent episode manic: Secondary | ICD-10-CM | POA: Diagnosis not present

## 2021-11-06 DIAGNOSIS — F603 Borderline personality disorder: Secondary | ICD-10-CM | POA: Diagnosis not present

## 2021-11-06 DIAGNOSIS — F411 Generalized anxiety disorder: Secondary | ICD-10-CM | POA: Diagnosis not present

## 2021-11-27 DIAGNOSIS — F411 Generalized anxiety disorder: Secondary | ICD-10-CM | POA: Diagnosis not present

## 2021-11-27 DIAGNOSIS — F603 Borderline personality disorder: Secondary | ICD-10-CM | POA: Diagnosis not present

## 2021-11-27 DIAGNOSIS — F3174 Bipolar disorder, in full remission, most recent episode manic: Secondary | ICD-10-CM | POA: Diagnosis not present

## 2021-11-29 ENCOUNTER — Encounter (HOSPITAL_COMMUNITY): Payer: Self-pay | Admitting: Physician Assistant

## 2021-11-29 ENCOUNTER — Telehealth (INDEPENDENT_AMBULATORY_CARE_PROVIDER_SITE_OTHER): Payer: BC Managed Care – PPO | Admitting: Physician Assistant

## 2021-11-29 DIAGNOSIS — F319 Bipolar disorder, unspecified: Secondary | ICD-10-CM

## 2021-11-29 DIAGNOSIS — F603 Borderline personality disorder: Secondary | ICD-10-CM

## 2021-11-29 MED ORDER — LAMOTRIGINE 100 MG PO TABS: 100.0000 mg | ORAL_TABLET | Freq: Two times a day (BID) | ORAL | 3 refills | Status: AC

## 2021-11-29 MED ORDER — QUETIAPINE FUMARATE 200 MG PO TABS: 200.0000 mg | ORAL_TABLET | Freq: Every day | ORAL | 3 refills | Status: AC

## 2021-11-29 MED ORDER — BUPROPION HCL ER (XL) 150 MG PO TB24: 150.0000 mg | ORAL_TABLET | ORAL | 3 refills | Status: DC

## 2021-11-29 NOTE — Progress Notes (Cosign Needed Addendum)
BH MD/PA/NP OP Progress Note  Virtual Visit via Video Note  I connected with [name] on 11/29/21 at  3:00 PM EDT by a video enabled telemedicine application and verified that I am speaking with the correct person using two identifiers.  Location: Patient: Home Provider: Clinic   I discussed the limitations of evaluation and management by telemedicine and the availability of in person appointments. The patient expressed understanding and agreed to proceed.  Follow Up Instructions:   I discussed the assessment and treatment plan with the patient. The patient was provided an opportunity to ask questions and all were answered. The patient agreed with the plan and demonstrated an understanding of the instructions.   The patient was advised to call back or seek an in-person evaluation if the symptoms worsen or if the condition fails to improve as anticipated.  I provided 7 minutes of non-face-to-face time during this encounter.  Meta Hatchet, PA    11/29/2021 5:53 PM [name]  MRN:  [id number]  Chief Complaint:  Chief Complaint   Follow-up; Medication Refill     HPI:   [name] "[name]" is a 21 year old transgender female with a past psychiatric history significant for borderline personality disorder, PTSD, generalized anxiety disorder, and bipolar disorder who presents to [facility] via virtual video visit for follow-up and medication management.  Patient is currently being managed on the following medications:  Bupropion (Wellbutrin XL) 150 mg 24-hour tablet daily Seroquel 200 mg at bedtime Lamictal 100 mg 2 times daily  Patient reports that the use of his medications have been going well.  Provider asked about a message that the patient sent to the provider a while back, to which the patient states that the situation has been resolved and states that the issue was merely situational in nature.  Patient states that their  life has been going better than usual and denies any major stressors at this time.  He does express that she was experiencing depression a couple months ago but states that their symptoms have since dissipated.  Patient denies experiencing much anxiety at this time.  Patient is alert and oriented x4, calm, cooperative, and fully engaged in conversation during the encounter.  Patient endorses good mood.  Patient denies suicidal or homicidal ideations.  He further denies auditory or visual hallucinations and does not appear to be responding to internal/external stimuli.  Patient endorses good sleep and receives on average 9 hours of sleep each night.  Patient endorses good appetite and eats on average 2 meals per day.  Patient denies alcohol consumption.  Patient denies tobacco use but does engage in vaping.  Patient endorses illicit drug use in the form of marijuana.  Visit Diagnosis:    ICD-10-CM   1. Bipolar affective disorder, remission status unspecified (HCC)  F31.9 buPROPion (WELLBUTRIN XL) 150 MG 24 hr tablet    QUEtiapine (SEROQUEL) 200 MG tablet    2. Borderline personality disorder (HCC)  F60.3 lamoTRIgine (LAMICTAL) 100 MG tablet      Past Psychiatric History:  Borderline Personality Disorder Generalized Anxiety Disorder PTSD Bipolar Disorder  Past Medical History:  Past Medical History:  Diagnosis Date   Anxiety    Chondromalacia of knee    right   Depression    Patellar instability of right knee    Vision abnormalities    wears glasses    Past Surgical History:  Procedure Laterality Date   CYST EXCISION Left 12/25/1999  inclusion cyst forehead   KIDNEY SURGERY  02/2004   [facility] reflux at age 21 [facility]   KNEE ARTHROSCOPY WITH MEDIAL PATELLAR FEMORAL LIGAMENT RECONSTRUCTION Right 10/04/2013   Procedure: RIGHT ARTHROSCOPY KNEE, EXCISION OF LOOSE BODIES, CHONDROPLASTY , PATELLA FEMORAL LIGAMENT RECONSTRUCTION ;  Surgeon: Velna OchsPeter G Dalldorf, MD;  Location:  [facility];  Service: Orthopedics;  Laterality: Right;   TONSILLECTOMY  09/2007   [facility]    Family Psychiatric History:  Grandmother - Substance abuse Aunt - bipolar disorder  Family History: History reviewed. No pertinent family history.  Social History:  Social History   Socioeconomic History   Marital status: Single    Spouse name: Not on file   Number of children: Not on file   Years of education: 13 yrs    Highest education level: 12th grade  Occupational History   Occupation: Musicianrestaurant work  Tobacco Use   Smoking status: Some Days    Packs/day: 0.50    Types: E-cigarettes, Cigarettes   Smokeless tobacco: Never   Tobacco comments:    vapor approximately 5 yrs  Vaping Use   Vaping Use: Every day   Start date: 04/29/2014   Last attempt to quit: 04/29/2018   Devices: tobacco vap started 5 yrs ago  Substance and Sexual Activity   Alcohol use: Yes    Alcohol/week: 3.0 standard drinks    Types: 3 Shots of liquor per week    Comment: may drink 3 drink liquor every 3 months   Drug use: No   Sexual activity: Yes    Birth control/protection: Pill  Other Topics Concern   Not on file  Social History Narrative   Not on file   Social Determinants of Health   Financial Resource Strain: Not on file  Food Insecurity: Not on file  Transportation Needs: Not on file  Physical Activity: Not on file  Stress: Not on file  Social Connections: Not on file    Allergies: No Known Allergies  Metabolic Disorder Labs: Lab Results  Component Value Date   HGBA1C 4.7 (L) 05/01/2018   MPG 88.19 05/01/2018   Lab Results  Component Value Date   PROLACTIN 29.2 (H) 05/01/2018   Lab Results  Component Value Date   CHOL 225 (H) 05/01/2018   TRIG 173 (H) 05/01/2018   HDL 39 (L) 05/01/2018   CHOLHDL 5.8 05/01/2018   VLDL 35 05/01/2018   LDLCALC 151 (H) 05/01/2018   Lab Results  Component Value Date   TSH 1.491 05/01/2018    Therapeutic Level  Labs: No results found for: LITHIUM No results found for: VALPROATE No components found for:  CBMZ  Current Medications: Current Outpatient Medications  Medication Sig Dispense Refill   buPROPion (WELLBUTRIN XL) 150 MG 24 hr tablet Take 1 tablet (150 mg total) by mouth every morning. 30 tablet 3   busPIRone (BUSPAR) 7.5 MG tablet TAKE ONE TABLET BY MOUTH EVERY MORNING AND AT BEDTIME 60 tablet 1   escitalopram (LEXAPRO) 5 MG tablet Take 1 tablet (5 mg total) by mouth daily. Patient to take 5 mg for 3 days, prior to taking 10 mg for 3 days. 3 tablet 0   fluticasone (FLONASE) 50 MCG/ACT nasal spray Place 2 sprays into both nostrils daily. For allergies  0   hydrOXYzine (ATARAX/VISTARIL) 50 MG tablet Take 1 tablet (50 mg total) by mouth every 6 (six) hours as needed for anxiety. 60 tablet 0   lamoTRIgine (LAMICTAL) 100 MG tablet Take  1 tablet (100 mg total) by mouth 2 (two) times daily. 60 tablet 3   loratadine (CLARITIN) 10 MG tablet Take 1 tablet (10 mg total) by mouth daily. (May buy from over the counter): For allergies     nicotine (NICODERM CQ - DOSED IN MG/24 HOURS) 21 mg/24hr patch Place 1 patch (21 mg total) onto the skin daily. (May buy from over the counter): For smoking cessation 28 patch 0   QUEtiapine (SEROQUEL) 200 MG tablet Take 1 tablet (200 mg total) by mouth at bedtime. 30 tablet 3   No current facility-administered medications for this visit.     Musculoskeletal: Strength & Muscle Tone: Unable to assess due to telemedicine visit Gait & Station: Unable to assess due to telemedicine visit Patient leans: Unable to assess due to telemedicine visit  Psychiatric Specialty Exam: Review of Systems  Psychiatric/Behavioral:  Negative for decreased concentration, dysphoric mood, hallucinations, self-injury, sleep disturbance and suicidal ideas. The patient is not nervous/anxious and is not hyperactive.    There were no vitals taken for this visit.There is no height or weight on  file to calculate BMI.  General Appearance: Casual  Eye Contact:  Good  Speech:  Clear and Coherent and Normal Rate  Volume:  Normal  Mood:  Euthymic  Affect:  Appropriate  Thought Process:  Coherent and Descriptions of Associations: Intact  Orientation:  Full (Time, Place, and Person)  Thought Content: WDL   Suicidal Thoughts:  No  Homicidal Thoughts:  No  Memory:  Immediate;   Good Recent;   Good Remote;   Good  Judgement:  Good  Insight:  Good  Psychomotor Activity:  Normal  Concentration:  Concentration: Good and Attention Span: Good  Recall:  Good  Fund of Knowledge: Good  Language: Good  Akathisia:  No  Handed:  Right  AIMS (if indicated): not done  Assets:  Communication Skills Desire for Improvement Housing Intimacy Physical Health Social Support Vocational/Educational  ADL's:  Intact  Cognition: WNL  Sleep:  Good   Screenings: AIMS    Flowsheet Row [facility]  AIMS Total Score 0      AUDIT    Flowsheet Row [facility]  Alcohol Use Disorder Identification Test Final Score (AUDIT) 2      GAD-7    Flowsheet Row Video Visit from 11/29/2021 in [facility] Video Visit from 08/07/2021 in [facility] Video Visit from 06/05/2021 in [facility] Video Visit from 03/13/2021 in [facility] Video Visit from 01/09/2021 in [facility]  Total GAD-7 Score 1 5 8 6 2       PHQ2-9    Flowsheet Row Video Visit from 11/29/2021 in [facility] Video Visit from 08/07/2021 in [facility] Video Visit from 06/05/2021 in [facility] Video Visit from 03/13/2021 in [facility] Video  Visit from 01/09/2021 in [facility]  PHQ-2 Total Score 1 2 2  0 4  PHQ-9 Total Score -- 6 6 -- 12      Flowsheet Row Video Visit from 11/29/2021 in [facility] Video Visit from 08/07/2021 in [facility] Video Visit from 06/05/2021 in [facility]  C-SSRS RISK CATEGORY No Risk No Risk Low Risk  Assessment and Plan:   [name] "[name]" is a 21 year old transgender female with a past psychiatric history significant for borderline personality disorder, PTSD, generalized anxiety disorder, and bipolar disorder who presents to [facility] via virtual video visit for follow-up and medication management.  Patient reports no issues with their current medication regimen.  Although patient endorsed some depressive symptoms 2 months ago, he denies experiencing any depressive symptoms at this time and states that their anxiety has been manageable.  Patient appears stable on the current medication regimen and will continue to take their medications as prescribed.  Patient's medications to be e-prescribed to pharmacy of choice.  Collaboration of Care: Collaboration of Care: Medication Management AEB provider managing patient's psychiatric medications, Primary Care Provider AEB patient being followed by a primary care provider, and Psychiatrist AEB patient being followed by mental health provider  Patient/Guardian was advised Release of Information must be obtained prior to any record release in order to collaborate their care with an outside provider. Patient/Guardian was advised if they have not already done so to contact the registration department to sign all necessary forms in order for [place] to release information regarding their care.   Consent: Patient/Guardian gives verbal consent for treatment and assignment of benefits for services provided during  this visit. Patient/Guardian expressed understanding and agreed to proceed.    1. Bipolar affective disorder, remission status unspecified (HCC)  - buPROPion (WELLBUTRIN XL) 150 MG 24 hr tablet; Take 1 tablet (150 mg total) by mouth every morning.  Dispense: 30 tablet; Refill: 3 - QUEtiapine (SEROQUEL) 200 MG tablet; Take 1 tablet (200 mg total) by mouth at bedtime.  Dispense: 30 tablet; Refill: 3  2. Borderline personality disorder (HCC)  - lamoTRIgine (LAMICTAL) 100 MG tablet; Take 1 tablet (100 mg total) by mouth 2 (two) times daily.  Dispense: 60 tablet; Refill: 3  Patient to follow up in 3 months Provider spent a total of 7 minutes with the patient/reviewing patient's chart  Meta Hatchet, PA 11/29/2021, 5:53 PM

## 2021-12-04 DIAGNOSIS — F603 Borderline personality disorder: Secondary | ICD-10-CM | POA: Diagnosis not present

## 2021-12-04 DIAGNOSIS — F411 Generalized anxiety disorder: Secondary | ICD-10-CM | POA: Diagnosis not present

## 2021-12-04 DIAGNOSIS — F3174 Bipolar disorder, in full remission, most recent episode manic: Secondary | ICD-10-CM | POA: Diagnosis not present

## 2021-12-11 DIAGNOSIS — F411 Generalized anxiety disorder: Secondary | ICD-10-CM | POA: Diagnosis not present

## 2021-12-11 DIAGNOSIS — F603 Borderline personality disorder: Secondary | ICD-10-CM | POA: Diagnosis not present

## 2021-12-11 DIAGNOSIS — F3174 Bipolar disorder, in full remission, most recent episode manic: Secondary | ICD-10-CM | POA: Diagnosis not present

## 2021-12-18 DIAGNOSIS — F3174 Bipolar disorder, in full remission, most recent episode manic: Secondary | ICD-10-CM | POA: Diagnosis not present

## 2021-12-18 DIAGNOSIS — F603 Borderline personality disorder: Secondary | ICD-10-CM | POA: Diagnosis not present

## 2021-12-18 DIAGNOSIS — F411 Generalized anxiety disorder: Secondary | ICD-10-CM | POA: Diagnosis not present

## 2021-12-24 DIAGNOSIS — F3174 Bipolar disorder, in full remission, most recent episode manic: Secondary | ICD-10-CM | POA: Diagnosis not present

## 2021-12-24 DIAGNOSIS — F603 Borderline personality disorder: Secondary | ICD-10-CM | POA: Diagnosis not present

## 2021-12-24 DIAGNOSIS — F411 Generalized anxiety disorder: Secondary | ICD-10-CM | POA: Diagnosis not present

## 2021-12-30 ENCOUNTER — Other Ambulatory Visit (HOSPITAL_COMMUNITY): Payer: Self-pay | Admitting: Physician Assistant

## 2021-12-30 DIAGNOSIS — F411 Generalized anxiety disorder: Secondary | ICD-10-CM

## 2021-12-31 DIAGNOSIS — F411 Generalized anxiety disorder: Secondary | ICD-10-CM | POA: Diagnosis not present

## 2021-12-31 DIAGNOSIS — F3174 Bipolar disorder, in full remission, most recent episode manic: Secondary | ICD-10-CM | POA: Diagnosis not present

## 2021-12-31 DIAGNOSIS — F603 Borderline personality disorder: Secondary | ICD-10-CM | POA: Diagnosis not present

## 2022-01-08 ENCOUNTER — Telehealth (HOSPITAL_COMMUNITY): Payer: Self-pay | Admitting: *Deleted

## 2022-01-08 ENCOUNTER — Other Ambulatory Visit (HOSPITAL_COMMUNITY): Payer: Self-pay | Admitting: Psychiatry

## 2022-01-08 DIAGNOSIS — F411 Generalized anxiety disorder: Secondary | ICD-10-CM

## 2022-01-08 MED ORDER — BUSPIRONE HCL 7.5 MG PO TABS: ORAL_TABLET | ORAL | 3 refills | Status: AC

## 2022-01-08 NOTE — Telephone Encounter (Signed)
Call from [facility] stating he was expecting a Bupsar rx to be sent in to his pharmacy per [facility] but it is not there. Reveiwed chart, he should be out but there is no mention of Buspar in providers note other than listing it as a med he is taking. He was last seen on 11/29/21 and has a future appt with Eddie PA on 03/04/22. Will forward this request to [facility] DNP as Link Snuffer is out of the office for an extended time. To sent in to [facility].

## 2022-01-14 DIAGNOSIS — F411 Generalized anxiety disorder: Secondary | ICD-10-CM | POA: Diagnosis not present

## 2022-01-14 DIAGNOSIS — F603 Borderline personality disorder: Secondary | ICD-10-CM | POA: Diagnosis not present

## 2022-01-14 DIAGNOSIS — F3174 Bipolar disorder, in full remission, most recent episode manic: Secondary | ICD-10-CM | POA: Diagnosis not present

## 2022-01-21 DIAGNOSIS — F411 Generalized anxiety disorder: Secondary | ICD-10-CM | POA: Diagnosis not present

## 2022-01-21 DIAGNOSIS — F3174 Bipolar disorder, in full remission, most recent episode manic: Secondary | ICD-10-CM | POA: Diagnosis not present

## 2022-01-21 DIAGNOSIS — F603 Borderline personality disorder: Secondary | ICD-10-CM | POA: Diagnosis not present

## 2022-01-28 DIAGNOSIS — F649 Gender identity disorder, unspecified: Secondary | ICD-10-CM | POA: Diagnosis not present

## 2022-01-28 DIAGNOSIS — Z79899 Other long term (current) drug therapy: Secondary | ICD-10-CM | POA: Diagnosis not present

## 2022-01-28 DIAGNOSIS — F411 Generalized anxiety disorder: Secondary | ICD-10-CM | POA: Diagnosis not present

## 2022-01-28 DIAGNOSIS — F603 Borderline personality disorder: Secondary | ICD-10-CM | POA: Diagnosis not present

## 2022-01-28 DIAGNOSIS — F3174 Bipolar disorder, in full remission, most recent episode manic: Secondary | ICD-10-CM | POA: Diagnosis not present

## 2022-02-04 DIAGNOSIS — F411 Generalized anxiety disorder: Secondary | ICD-10-CM | POA: Diagnosis not present

## 2022-02-04 DIAGNOSIS — F3174 Bipolar disorder, in full remission, most recent episode manic: Secondary | ICD-10-CM | POA: Diagnosis not present

## 2022-02-04 DIAGNOSIS — F603 Borderline personality disorder: Secondary | ICD-10-CM | POA: Diagnosis not present

## 2022-02-11 DIAGNOSIS — F411 Generalized anxiety disorder: Secondary | ICD-10-CM | POA: Diagnosis not present

## 2022-02-11 DIAGNOSIS — F3174 Bipolar disorder, in full remission, most recent episode manic: Secondary | ICD-10-CM | POA: Diagnosis not present

## 2022-02-11 DIAGNOSIS — F603 Borderline personality disorder: Secondary | ICD-10-CM | POA: Diagnosis not present

## 2022-02-18 DIAGNOSIS — F603 Borderline personality disorder: Secondary | ICD-10-CM | POA: Diagnosis not present

## 2022-02-18 DIAGNOSIS — F411 Generalized anxiety disorder: Secondary | ICD-10-CM | POA: Diagnosis not present

## 2022-02-18 DIAGNOSIS — F3174 Bipolar disorder, in full remission, most recent episode manic: Secondary | ICD-10-CM | POA: Diagnosis not present

## 2022-02-25 DIAGNOSIS — F3174 Bipolar disorder, in full remission, most recent episode manic: Secondary | ICD-10-CM | POA: Diagnosis not present

## 2022-02-25 DIAGNOSIS — F411 Generalized anxiety disorder: Secondary | ICD-10-CM | POA: Diagnosis not present

## 2022-02-25 DIAGNOSIS — F603 Borderline personality disorder: Secondary | ICD-10-CM | POA: Diagnosis not present

## 2022-03-04 ENCOUNTER — Telehealth (HOSPITAL_COMMUNITY): Payer: BC Managed Care – PPO | Admitting: Physician Assistant

## 2022-03-24 ENCOUNTER — Telehealth (HOSPITAL_COMMUNITY): Payer: Self-pay | Admitting: *Deleted

## 2022-03-24 NOTE — Telephone Encounter (Signed)
Patient Called  "Stated that he Wanted Eddie to know that he has STOP Taking   QUEtiapine (SEROQUEL) 200 MG tablet X 1 week ago"

## 2022-03-24 NOTE — Telephone Encounter (Signed)
Message acknowledged and reviewed.

## 2022-03-25 DIAGNOSIS — F603 Borderline personality disorder: Secondary | ICD-10-CM | POA: Diagnosis not present

## 2022-03-25 DIAGNOSIS — F3174 Bipolar disorder, in full remission, most recent episode manic: Secondary | ICD-10-CM | POA: Diagnosis not present

## 2022-03-25 DIAGNOSIS — F411 Generalized anxiety disorder: Secondary | ICD-10-CM | POA: Diagnosis not present

## 2022-04-01 DIAGNOSIS — F3174 Bipolar disorder, in full remission, most recent episode manic: Secondary | ICD-10-CM | POA: Diagnosis not present

## 2022-04-01 DIAGNOSIS — F411 Generalized anxiety disorder: Secondary | ICD-10-CM | POA: Diagnosis not present

## 2022-04-01 DIAGNOSIS — F603 Borderline personality disorder: Secondary | ICD-10-CM | POA: Diagnosis not present

## 2022-04-08 DIAGNOSIS — Z23 Encounter for immunization: Secondary | ICD-10-CM | POA: Diagnosis not present

## 2022-04-08 DIAGNOSIS — Z13220 Encounter for screening for lipoid disorders: Secondary | ICD-10-CM | POA: Diagnosis not present

## 2022-04-08 DIAGNOSIS — Z0000 Encounter for general adult medical examination without abnormal findings: Secondary | ICD-10-CM | POA: Diagnosis not present

## 2022-04-08 DIAGNOSIS — Z1159 Encounter for screening for other viral diseases: Secondary | ICD-10-CM | POA: Diagnosis not present

## 2022-04-15 DIAGNOSIS — F603 Borderline personality disorder: Secondary | ICD-10-CM | POA: Diagnosis not present

## 2022-04-15 DIAGNOSIS — F3174 Bipolar disorder, in full remission, most recent episode manic: Secondary | ICD-10-CM | POA: Diagnosis not present

## 2022-04-15 DIAGNOSIS — F411 Generalized anxiety disorder: Secondary | ICD-10-CM | POA: Diagnosis not present

## 2022-04-22 DIAGNOSIS — F3174 Bipolar disorder, in full remission, most recent episode manic: Secondary | ICD-10-CM | POA: Diagnosis not present

## 2022-04-22 DIAGNOSIS — F603 Borderline personality disorder: Secondary | ICD-10-CM | POA: Diagnosis not present

## 2022-04-22 DIAGNOSIS — F411 Generalized anxiety disorder: Secondary | ICD-10-CM | POA: Diagnosis not present

## 2022-04-23 DIAGNOSIS — F640 Transsexualism: Secondary | ICD-10-CM | POA: Diagnosis not present

## 2022-04-29 DIAGNOSIS — F411 Generalized anxiety disorder: Secondary | ICD-10-CM | POA: Diagnosis not present

## 2022-04-29 DIAGNOSIS — F3174 Bipolar disorder, in full remission, most recent episode manic: Secondary | ICD-10-CM | POA: Diagnosis not present

## 2022-04-29 DIAGNOSIS — F603 Borderline personality disorder: Secondary | ICD-10-CM | POA: Diagnosis not present

## 2022-05-06 DIAGNOSIS — F411 Generalized anxiety disorder: Secondary | ICD-10-CM | POA: Diagnosis not present

## 2022-05-06 DIAGNOSIS — F603 Borderline personality disorder: Secondary | ICD-10-CM | POA: Diagnosis not present

## 2022-05-06 DIAGNOSIS — F3174 Bipolar disorder, in full remission, most recent episode manic: Secondary | ICD-10-CM | POA: Diagnosis not present

## 2022-05-13 DIAGNOSIS — F603 Borderline personality disorder: Secondary | ICD-10-CM | POA: Diagnosis not present

## 2022-05-13 DIAGNOSIS — F411 Generalized anxiety disorder: Secondary | ICD-10-CM | POA: Diagnosis not present

## 2022-05-13 DIAGNOSIS — F3174 Bipolar disorder, in full remission, most recent episode manic: Secondary | ICD-10-CM | POA: Diagnosis not present

## 2022-05-14 ENCOUNTER — Other Ambulatory Visit (HOSPITAL_COMMUNITY): Payer: Self-pay | Admitting: Physician Assistant

## 2022-05-14 DIAGNOSIS — F603 Borderline personality disorder: Secondary | ICD-10-CM

## 2022-05-20 DIAGNOSIS — F603 Borderline personality disorder: Secondary | ICD-10-CM | POA: Diagnosis not present

## 2022-05-20 DIAGNOSIS — F3174 Bipolar disorder, in full remission, most recent episode manic: Secondary | ICD-10-CM | POA: Diagnosis not present

## 2022-05-20 DIAGNOSIS — F411 Generalized anxiety disorder: Secondary | ICD-10-CM | POA: Diagnosis not present

## 2022-05-27 DIAGNOSIS — F3174 Bipolar disorder, in full remission, most recent episode manic: Secondary | ICD-10-CM | POA: Diagnosis not present

## 2022-05-27 DIAGNOSIS — F411 Generalized anxiety disorder: Secondary | ICD-10-CM | POA: Diagnosis not present

## 2022-05-27 DIAGNOSIS — F603 Borderline personality disorder: Secondary | ICD-10-CM | POA: Diagnosis not present

## 2022-06-03 DIAGNOSIS — F603 Borderline personality disorder: Secondary | ICD-10-CM | POA: Diagnosis not present

## 2022-06-03 DIAGNOSIS — F411 Generalized anxiety disorder: Secondary | ICD-10-CM | POA: Diagnosis not present

## 2022-06-03 DIAGNOSIS — F3174 Bipolar disorder, in full remission, most recent episode manic: Secondary | ICD-10-CM | POA: Diagnosis not present

## 2022-06-10 DIAGNOSIS — F603 Borderline personality disorder: Secondary | ICD-10-CM | POA: Diagnosis not present

## 2022-06-10 DIAGNOSIS — F3174 Bipolar disorder, in full remission, most recent episode manic: Secondary | ICD-10-CM | POA: Diagnosis not present

## 2022-06-10 DIAGNOSIS — F411 Generalized anxiety disorder: Secondary | ICD-10-CM | POA: Diagnosis not present

## 2022-06-10 DIAGNOSIS — F319 Bipolar disorder, unspecified: Secondary | ICD-10-CM | POA: Diagnosis not present

## 2022-07-01 DIAGNOSIS — F603 Borderline personality disorder: Secondary | ICD-10-CM | POA: Diagnosis not present

## 2022-07-01 DIAGNOSIS — F411 Generalized anxiety disorder: Secondary | ICD-10-CM | POA: Diagnosis not present

## 2022-07-01 DIAGNOSIS — F3174 Bipolar disorder, in full remission, most recent episode manic: Secondary | ICD-10-CM | POA: Diagnosis not present

## 2022-07-15 DIAGNOSIS — F319 Bipolar disorder, unspecified: Secondary | ICD-10-CM | POA: Diagnosis not present

## 2022-07-17 DIAGNOSIS — F411 Generalized anxiety disorder: Secondary | ICD-10-CM | POA: Diagnosis not present

## 2022-07-17 DIAGNOSIS — F3174 Bipolar disorder, in full remission, most recent episode manic: Secondary | ICD-10-CM | POA: Diagnosis not present

## 2022-07-17 DIAGNOSIS — F603 Borderline personality disorder: Secondary | ICD-10-CM | POA: Diagnosis not present

## 2022-07-31 DIAGNOSIS — F3174 Bipolar disorder, in full remission, most recent episode manic: Secondary | ICD-10-CM | POA: Diagnosis not present

## 2022-07-31 DIAGNOSIS — F603 Borderline personality disorder: Secondary | ICD-10-CM | POA: Diagnosis not present

## 2022-07-31 DIAGNOSIS — H93293 Other abnormal auditory perceptions, bilateral: Secondary | ICD-10-CM | POA: Diagnosis not present

## 2022-07-31 DIAGNOSIS — F411 Generalized anxiety disorder: Secondary | ICD-10-CM | POA: Diagnosis not present

## 2022-08-07 DIAGNOSIS — F3174 Bipolar disorder, in full remission, most recent episode manic: Secondary | ICD-10-CM | POA: Diagnosis not present

## 2022-08-07 DIAGNOSIS — F411 Generalized anxiety disorder: Secondary | ICD-10-CM | POA: Diagnosis not present

## 2022-08-07 DIAGNOSIS — F603 Borderline personality disorder: Secondary | ICD-10-CM | POA: Diagnosis not present

## 2022-08-14 DIAGNOSIS — F603 Borderline personality disorder: Secondary | ICD-10-CM | POA: Diagnosis not present

## 2022-08-14 DIAGNOSIS — F411 Generalized anxiety disorder: Secondary | ICD-10-CM | POA: Diagnosis not present

## 2022-08-14 DIAGNOSIS — F3174 Bipolar disorder, in full remission, most recent episode manic: Secondary | ICD-10-CM | POA: Diagnosis not present

## 2022-08-21 DIAGNOSIS — F603 Borderline personality disorder: Secondary | ICD-10-CM | POA: Diagnosis not present

## 2022-08-21 DIAGNOSIS — F3174 Bipolar disorder, in full remission, most recent episode manic: Secondary | ICD-10-CM | POA: Diagnosis not present

## 2022-08-21 DIAGNOSIS — F411 Generalized anxiety disorder: Secondary | ICD-10-CM | POA: Diagnosis not present

## 2022-08-28 DIAGNOSIS — F603 Borderline personality disorder: Secondary | ICD-10-CM | POA: Diagnosis not present

## 2022-08-28 DIAGNOSIS — F411 Generalized anxiety disorder: Secondary | ICD-10-CM | POA: Diagnosis not present

## 2022-08-28 DIAGNOSIS — F3174 Bipolar disorder, in full remission, most recent episode manic: Secondary | ICD-10-CM | POA: Diagnosis not present

## 2022-09-11 DIAGNOSIS — F411 Generalized anxiety disorder: Secondary | ICD-10-CM | POA: Diagnosis not present

## 2022-09-11 DIAGNOSIS — F603 Borderline personality disorder: Secondary | ICD-10-CM | POA: Diagnosis not present

## 2022-09-11 DIAGNOSIS — F3174 Bipolar disorder, in full remission, most recent episode manic: Secondary | ICD-10-CM | POA: Diagnosis not present

## 2022-09-18 DIAGNOSIS — F411 Generalized anxiety disorder: Secondary | ICD-10-CM | POA: Diagnosis not present

## 2022-09-18 DIAGNOSIS — F3174 Bipolar disorder, in full remission, most recent episode manic: Secondary | ICD-10-CM | POA: Diagnosis not present

## 2022-09-18 DIAGNOSIS — F603 Borderline personality disorder: Secondary | ICD-10-CM | POA: Diagnosis not present

## 2022-09-25 DIAGNOSIS — F603 Borderline personality disorder: Secondary | ICD-10-CM | POA: Diagnosis not present

## 2022-09-25 DIAGNOSIS — F3174 Bipolar disorder, in full remission, most recent episode manic: Secondary | ICD-10-CM | POA: Diagnosis not present

## 2022-09-25 DIAGNOSIS — F411 Generalized anxiety disorder: Secondary | ICD-10-CM | POA: Diagnosis not present

## 2022-10-01 DIAGNOSIS — F3174 Bipolar disorder, in full remission, most recent episode manic: Secondary | ICD-10-CM | POA: Diagnosis not present

## 2022-10-01 DIAGNOSIS — F411 Generalized anxiety disorder: Secondary | ICD-10-CM | POA: Diagnosis not present

## 2022-10-01 DIAGNOSIS — F603 Borderline personality disorder: Secondary | ICD-10-CM | POA: Diagnosis not present

## 2022-10-02 DIAGNOSIS — F411 Generalized anxiety disorder: Secondary | ICD-10-CM | POA: Diagnosis not present

## 2022-10-02 DIAGNOSIS — F3174 Bipolar disorder, in full remission, most recent episode manic: Secondary | ICD-10-CM | POA: Diagnosis not present

## 2022-10-02 DIAGNOSIS — F603 Borderline personality disorder: Secondary | ICD-10-CM | POA: Diagnosis not present

## 2022-10-02 DIAGNOSIS — F319 Bipolar disorder, unspecified: Secondary | ICD-10-CM | POA: Diagnosis not present

## 2022-10-06 DIAGNOSIS — M25532 Pain in left wrist: Secondary | ICD-10-CM | POA: Diagnosis not present

## 2022-10-09 DIAGNOSIS — F3174 Bipolar disorder, in full remission, most recent episode manic: Secondary | ICD-10-CM | POA: Diagnosis not present

## 2022-10-09 DIAGNOSIS — F411 Generalized anxiety disorder: Secondary | ICD-10-CM | POA: Diagnosis not present

## 2022-10-09 DIAGNOSIS — F603 Borderline personality disorder: Secondary | ICD-10-CM | POA: Diagnosis not present

## 2022-10-16 DIAGNOSIS — F411 Generalized anxiety disorder: Secondary | ICD-10-CM | POA: Diagnosis not present

## 2022-10-16 DIAGNOSIS — F603 Borderline personality disorder: Secondary | ICD-10-CM | POA: Diagnosis not present

## 2022-10-16 DIAGNOSIS — F3174 Bipolar disorder, in full remission, most recent episode manic: Secondary | ICD-10-CM | POA: Diagnosis not present

## 2022-10-23 DIAGNOSIS — F411 Generalized anxiety disorder: Secondary | ICD-10-CM | POA: Diagnosis not present

## 2022-10-23 DIAGNOSIS — F603 Borderline personality disorder: Secondary | ICD-10-CM | POA: Diagnosis not present

## 2022-10-23 DIAGNOSIS — F3174 Bipolar disorder, in full remission, most recent episode manic: Secondary | ICD-10-CM | POA: Diagnosis not present

## 2022-11-06 DIAGNOSIS — F603 Borderline personality disorder: Secondary | ICD-10-CM | POA: Diagnosis not present

## 2022-11-06 DIAGNOSIS — F411 Generalized anxiety disorder: Secondary | ICD-10-CM | POA: Diagnosis not present

## 2022-11-06 DIAGNOSIS — F3174 Bipolar disorder, in full remission, most recent episode manic: Secondary | ICD-10-CM | POA: Diagnosis not present

## 2022-12-04 DIAGNOSIS — F411 Generalized anxiety disorder: Secondary | ICD-10-CM | POA: Diagnosis not present

## 2022-12-04 DIAGNOSIS — F3174 Bipolar disorder, in full remission, most recent episode manic: Secondary | ICD-10-CM | POA: Diagnosis not present

## 2022-12-04 DIAGNOSIS — F603 Borderline personality disorder: Secondary | ICD-10-CM | POA: Diagnosis not present

## 2022-12-18 DIAGNOSIS — F411 Generalized anxiety disorder: Secondary | ICD-10-CM | POA: Diagnosis not present

## 2022-12-18 DIAGNOSIS — F3174 Bipolar disorder, in full remission, most recent episode manic: Secondary | ICD-10-CM | POA: Diagnosis not present

## 2022-12-18 DIAGNOSIS — F603 Borderline personality disorder: Secondary | ICD-10-CM | POA: Diagnosis not present

## 2023-01-13 DIAGNOSIS — G43909 Migraine, unspecified, not intractable, without status migrainosus: Secondary | ICD-10-CM | POA: Diagnosis not present

## 2023-01-13 DIAGNOSIS — F640 Transsexualism: Secondary | ICD-10-CM | POA: Diagnosis not present

## 2023-01-13 DIAGNOSIS — F419 Anxiety disorder, unspecified: Secondary | ICD-10-CM | POA: Diagnosis not present

## 2023-01-15 DIAGNOSIS — F3174 Bipolar disorder, in full remission, most recent episode manic: Secondary | ICD-10-CM | POA: Diagnosis not present

## 2023-01-15 DIAGNOSIS — F411 Generalized anxiety disorder: Secondary | ICD-10-CM | POA: Diagnosis not present

## 2023-01-15 DIAGNOSIS — F603 Borderline personality disorder: Secondary | ICD-10-CM | POA: Diagnosis not present

## 2023-01-29 DIAGNOSIS — F3174 Bipolar disorder, in full remission, most recent episode manic: Secondary | ICD-10-CM | POA: Diagnosis not present

## 2023-01-29 DIAGNOSIS — F411 Generalized anxiety disorder: Secondary | ICD-10-CM | POA: Diagnosis not present

## 2023-01-29 DIAGNOSIS — F603 Borderline personality disorder: Secondary | ICD-10-CM | POA: Diagnosis not present

## 2023-02-03 DIAGNOSIS — F319 Bipolar disorder, unspecified: Secondary | ICD-10-CM | POA: Diagnosis not present

## 2023-02-12 DIAGNOSIS — F3174 Bipolar disorder, in full remission, most recent episode manic: Secondary | ICD-10-CM | POA: Diagnosis not present

## 2023-02-12 DIAGNOSIS — F603 Borderline personality disorder: Secondary | ICD-10-CM | POA: Diagnosis not present

## 2023-02-12 DIAGNOSIS — F411 Generalized anxiety disorder: Secondary | ICD-10-CM | POA: Diagnosis not present

## 2023-03-03 DIAGNOSIS — F319 Bipolar disorder, unspecified: Secondary | ICD-10-CM | POA: Diagnosis not present

## 2023-03-12 DIAGNOSIS — F3174 Bipolar disorder, in full remission, most recent episode manic: Secondary | ICD-10-CM | POA: Diagnosis not present

## 2023-03-12 DIAGNOSIS — F411 Generalized anxiety disorder: Secondary | ICD-10-CM | POA: Diagnosis not present

## 2023-03-12 DIAGNOSIS — F603 Borderline personality disorder: Secondary | ICD-10-CM | POA: Diagnosis not present

## 2023-03-26 DIAGNOSIS — F603 Borderline personality disorder: Secondary | ICD-10-CM | POA: Diagnosis not present

## 2023-03-26 DIAGNOSIS — F411 Generalized anxiety disorder: Secondary | ICD-10-CM | POA: Diagnosis not present

## 2023-03-26 DIAGNOSIS — F3174 Bipolar disorder, in full remission, most recent episode manic: Secondary | ICD-10-CM | POA: Diagnosis not present

## 2023-04-09 DIAGNOSIS — F3174 Bipolar disorder, in full remission, most recent episode manic: Secondary | ICD-10-CM | POA: Diagnosis not present

## 2023-04-09 DIAGNOSIS — F411 Generalized anxiety disorder: Secondary | ICD-10-CM | POA: Diagnosis not present

## 2023-04-09 DIAGNOSIS — F603 Borderline personality disorder: Secondary | ICD-10-CM | POA: Diagnosis not present

## 2023-04-15 DIAGNOSIS — Z13220 Encounter for screening for lipoid disorders: Secondary | ICD-10-CM | POA: Diagnosis not present

## 2023-04-15 DIAGNOSIS — Z0000 Encounter for general adult medical examination without abnormal findings: Secondary | ICD-10-CM | POA: Diagnosis not present

## 2023-04-27 ENCOUNTER — Ambulatory Visit: Payer: BC Managed Care – PPO | Admitting: Podiatry

## 2023-05-04 DIAGNOSIS — F411 Generalized anxiety disorder: Secondary | ICD-10-CM | POA: Diagnosis not present

## 2023-05-07 DIAGNOSIS — F603 Borderline personality disorder: Secondary | ICD-10-CM | POA: Diagnosis not present

## 2023-05-07 DIAGNOSIS — F411 Generalized anxiety disorder: Secondary | ICD-10-CM | POA: Diagnosis not present

## 2023-05-07 DIAGNOSIS — F3174 Bipolar disorder, in full remission, most recent episode manic: Secondary | ICD-10-CM | POA: Diagnosis not present

## 2023-05-11 ENCOUNTER — Encounter: Payer: Self-pay | Admitting: Podiatry

## 2023-05-11 ENCOUNTER — Ambulatory Visit (INDEPENDENT_AMBULATORY_CARE_PROVIDER_SITE_OTHER): Payer: BC Managed Care – PPO | Admitting: Podiatry

## 2023-05-11 VITALS — Ht 62.5 in | Wt 127.0 lb

## 2023-05-11 DIAGNOSIS — L309 Dermatitis, unspecified: Secondary | ICD-10-CM

## 2023-05-11 MED ORDER — CLOTRIMAZOLE-BETAMETHASONE 1-0.05 % EX CREA: 1.0000 | TOPICAL_CREAM | Freq: Two times a day (BID) | CUTANEOUS | 1 refills | Status: AC

## 2023-05-11 NOTE — Progress Notes (Signed)
   Chief Complaint  Patient presents with   Callouses    Pt is here due to callous on right greater toe    HPI: 22 y.o. adult presenting today as a new patient for evaluation of itching with inflammatory skin dermatitis to the right foot.  Onset about 6 months ago.  Has been applying triamcinolone cream for about 2 weeks prescribed by his PCP.  Modest improvement.  Past Medical History:  Diagnosis Date   Anxiety    Chondromalacia of knee    right   Depression    Patellar instability of right knee    Vision abnormalities    wears glasses    Past Surgical History:  Procedure Laterality Date   CYST EXCISION Left 12/25/1999   inclusion cyst forehead   KIDNEY SURGERY  02/2004   for reflux at age 75 at [facility]   KNEE ARTHROSCOPY WITH MEDIAL PATELLAR FEMORAL LIGAMENT RECONSTRUCTION Right 10/04/2013   Procedure: RIGHT ARTHROSCOPY KNEE, EXCISION OF LOOSE BODIES, CHONDROPLASTY , PATELLA FEMORAL LIGAMENT RECONSTRUCTION ;  Surgeon: Velna Ochs, MD;  Location: [facility];  Service: Orthopedics;  Laterality: Right;   TONSILLECTOMY  09/2007   [facility]    No Known Allergies   RT foot 05/11/2023  RT foot 05/11/2023  RT foot 05/11/2023   Physical Exam: General: The patient is alert and oriented x3 in no acute distress.  Dermatology: Skin is warm, dry and supple bilateral lower extremities.  Inflammatory dermatitis with superficial open wounds noted to the right foot.  Please see above noted photos.  Pruritus also noted.  No purulence.  No deeper underlying erythema that would be concerning for cellulitis  Vascular: Palpable pedal pulses bilaterally. Capillary refill within normal limits.  No appreciable edema.  No erythema.  Neurological: Grossly intact via light touch  Musculoskeletal Exam: No pedal deformities noted   Assessment/Plan of Care: 1.  Inflammatory dermatitis of right foot  -Patient evaluated -Light debridement of the  peeling skin was performed today using a tissue nipper.  Patient tolerated this well -Patient has noticed some slight improvement with the triamcinolone.  If there is a fungal component to the patient's foot presentation I would like to include antifungal cream as well -Prescription for Lotrisone cream.  Apply twice daily.  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 Felecia Shelling, DPM    [address] [place], [place] [address]                Office [phone]  Fax [phone]

## 2023-05-21 DIAGNOSIS — F411 Generalized anxiety disorder: Secondary | ICD-10-CM | POA: Diagnosis not present

## 2023-05-21 DIAGNOSIS — F603 Borderline personality disorder: Secondary | ICD-10-CM | POA: Diagnosis not present

## 2023-05-21 DIAGNOSIS — F3174 Bipolar disorder, in full remission, most recent episode manic: Secondary | ICD-10-CM | POA: Diagnosis not present

## 2023-06-02 DIAGNOSIS — F411 Generalized anxiety disorder: Secondary | ICD-10-CM | POA: Diagnosis not present

## 2023-06-04 DIAGNOSIS — F3174 Bipolar disorder, in full remission, most recent episode manic: Secondary | ICD-10-CM | POA: Diagnosis not present

## 2023-06-04 DIAGNOSIS — F603 Borderline personality disorder: Secondary | ICD-10-CM | POA: Diagnosis not present

## 2023-06-04 DIAGNOSIS — F411 Generalized anxiety disorder: Secondary | ICD-10-CM | POA: Diagnosis not present

## 2023-06-15 ENCOUNTER — Ambulatory Visit (INDEPENDENT_AMBULATORY_CARE_PROVIDER_SITE_OTHER): Payer: BC Managed Care – PPO | Admitting: Podiatry

## 2023-06-15 ENCOUNTER — Encounter: Payer: Self-pay | Admitting: Podiatry

## 2023-06-15 DIAGNOSIS — L309 Dermatitis, unspecified: Secondary | ICD-10-CM

## 2023-06-15 NOTE — Progress Notes (Signed)
   Chief Complaint  Patient presents with   dry feet    Patient states he the ezcema started in July of this year, patient used ezcema cream on his right foot where the ezcema is , and a doctor also prescribed him some ointment with anti fungal in it.    HPI: 22 y.o. adult presenting today for follow-up evaluation of itching and inflammatory skin dermatitis to the right foot.  Onset over 6 months ago.  For the last 6 months the patient has been applying triamcinolone cream.  Last visit on 05/11/2023 this was discontinued and a prescription for Lotrisone cream was prescribed.  There has been no improvement.  Past Medical History:  Diagnosis Date   Anxiety    Chondromalacia of knee    right   Depression    Patellar instability of right knee    Vision abnormalities    wears glasses    Past Surgical History:  Procedure Laterality Date   CYST EXCISION Left 12/25/1999   inclusion cyst forehead   KIDNEY SURGERY  02/2004   for reflux at age 40 at [facility]   KNEE ARTHROSCOPY WITH MEDIAL PATELLAR FEMORAL LIGAMENT RECONSTRUCTION Right 10/04/2013   Procedure: RIGHT ARTHROSCOPY KNEE, EXCISION OF LOOSE BODIES, CHONDROPLASTY , PATELLA FEMORAL LIGAMENT RECONSTRUCTION ;  Surgeon: Velna Ochs, MD;  Location: [facility];  Service: Orthopedics;  Laterality: Right;   TONSILLECTOMY  09/2007   [facility]    No Known Allergies   RT foot 05/11/2023  RT foot 05/11/2023  RT foot 05/11/2023   Physical Exam: General: The patient is alert and oriented x3 in no acute distress.  Dermatology: Unchanged.  Skin is warm, dry and supple bilateral lower extremities.  Inflammatory dermatitis with superficial open wounds noted to the right foot.  Please see above noted photos.  Pruritus also noted.  No purulence.  No deeper underlying erythema that would be concerning for cellulitis  Vascular: Palpable pedal pulses bilaterally. Capillary refill within normal limits.   No appreciable edema.  No erythema.  Neurological: Grossly intact via light touch  Musculoskeletal Exam: No pedal deformities noted   Assessment/Plan of Care: 1.  Inflammatory dermatitis/possible eczema of right foot  -Patient evaluated - Unfortunately there has been no improvement with the Lotrisone cream. 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 Felecia Shelling, DPM    [address] [place], [place] [address]                Office [phone]  Fax [phone]

## 2023-06-18 DIAGNOSIS — F603 Borderline personality disorder: Secondary | ICD-10-CM | POA: Diagnosis not present

## 2023-06-18 DIAGNOSIS — F411 Generalized anxiety disorder: Secondary | ICD-10-CM | POA: Diagnosis not present

## 2023-06-18 DIAGNOSIS — F3174 Bipolar disorder, in full remission, most recent episode manic: Secondary | ICD-10-CM | POA: Diagnosis not present

## 2023-07-02 DIAGNOSIS — F3174 Bipolar disorder, in full remission, most recent episode manic: Secondary | ICD-10-CM | POA: Diagnosis not present

## 2023-07-02 DIAGNOSIS — F603 Borderline personality disorder: Secondary | ICD-10-CM | POA: Diagnosis not present

## 2023-07-02 DIAGNOSIS — F411 Generalized anxiety disorder: Secondary | ICD-10-CM | POA: Diagnosis not present

## 2023-07-16 DIAGNOSIS — F3174 Bipolar disorder, in full remission, most recent episode manic: Secondary | ICD-10-CM | POA: Diagnosis not present

## 2023-07-16 DIAGNOSIS — F603 Borderline personality disorder: Secondary | ICD-10-CM | POA: Diagnosis not present

## 2023-07-16 DIAGNOSIS — F411 Generalized anxiety disorder: Secondary | ICD-10-CM | POA: Diagnosis not present

## 2023-07-30 DIAGNOSIS — F3174 Bipolar disorder, in full remission, most recent episode manic: Secondary | ICD-10-CM | POA: Diagnosis not present

## 2023-07-30 DIAGNOSIS — F603 Borderline personality disorder: Secondary | ICD-10-CM | POA: Diagnosis not present

## 2023-07-30 DIAGNOSIS — F411 Generalized anxiety disorder: Secondary | ICD-10-CM | POA: Diagnosis not present

## 2023-07-31 DIAGNOSIS — F411 Generalized anxiety disorder: Secondary | ICD-10-CM | POA: Diagnosis not present

## 2023-08-11 DIAGNOSIS — J069 Acute upper respiratory infection, unspecified: Secondary | ICD-10-CM | POA: Diagnosis not present

## 2023-08-13 DIAGNOSIS — F411 Generalized anxiety disorder: Secondary | ICD-10-CM | POA: Diagnosis not present

## 2023-08-13 DIAGNOSIS — F603 Borderline personality disorder: Secondary | ICD-10-CM | POA: Diagnosis not present

## 2023-08-13 DIAGNOSIS — F3174 Bipolar disorder, in full remission, most recent episode manic: Secondary | ICD-10-CM | POA: Diagnosis not present

## 2023-08-18 DIAGNOSIS — F1290 Cannabis use, unspecified, uncomplicated: Secondary | ICD-10-CM | POA: Diagnosis not present

## 2023-08-18 DIAGNOSIS — M2550 Pain in unspecified joint: Secondary | ICD-10-CM | POA: Diagnosis not present

## 2023-08-18 DIAGNOSIS — R058 Other specified cough: Secondary | ICD-10-CM | POA: Diagnosis not present

## 2023-08-18 DIAGNOSIS — J22 Unspecified acute lower respiratory infection: Secondary | ICD-10-CM | POA: Diagnosis not present

## 2023-08-25 DIAGNOSIS — J40 Bronchitis, not specified as acute or chronic: Secondary | ICD-10-CM | POA: Diagnosis not present

## 2023-08-27 DIAGNOSIS — F411 Generalized anxiety disorder: Secondary | ICD-10-CM | POA: Diagnosis not present

## 2023-08-27 DIAGNOSIS — F603 Borderline personality disorder: Secondary | ICD-10-CM | POA: Diagnosis not present

## 2023-08-27 DIAGNOSIS — F3174 Bipolar disorder, in full remission, most recent episode manic: Secondary | ICD-10-CM | POA: Diagnosis not present

## 2023-08-28 DIAGNOSIS — R058 Other specified cough: Secondary | ICD-10-CM | POA: Diagnosis not present

## 2023-09-10 DIAGNOSIS — F3174 Bipolar disorder, in full remission, most recent episode manic: Secondary | ICD-10-CM | POA: Diagnosis not present

## 2023-09-10 DIAGNOSIS — F411 Generalized anxiety disorder: Secondary | ICD-10-CM | POA: Diagnosis not present

## 2023-09-10 DIAGNOSIS — F603 Borderline personality disorder: Secondary | ICD-10-CM | POA: Diagnosis not present

## 2023-10-22 DIAGNOSIS — F411 Generalized anxiety disorder: Secondary | ICD-10-CM | POA: Diagnosis not present

## 2023-10-22 DIAGNOSIS — F603 Borderline personality disorder: Secondary | ICD-10-CM | POA: Diagnosis not present

## 2023-10-22 DIAGNOSIS — F3174 Bipolar disorder, in full remission, most recent episode manic: Secondary | ICD-10-CM | POA: Diagnosis not present

## 2023-11-04 DIAGNOSIS — M2550 Pain in unspecified joint: Secondary | ICD-10-CM | POA: Diagnosis not present

## 2023-11-04 DIAGNOSIS — R222 Localized swelling, mass and lump, trunk: Secondary | ICD-10-CM | POA: Diagnosis not present

## 2023-11-05 ENCOUNTER — Other Ambulatory Visit: Payer: Self-pay | Admitting: Family Medicine

## 2023-11-05 DIAGNOSIS — F3174 Bipolar disorder, in full remission, most recent episode manic: Secondary | ICD-10-CM | POA: Diagnosis not present

## 2023-11-05 DIAGNOSIS — R222 Localized swelling, mass and lump, trunk: Secondary | ICD-10-CM

## 2023-11-05 DIAGNOSIS — F603 Borderline personality disorder: Secondary | ICD-10-CM | POA: Diagnosis not present

## 2023-11-05 DIAGNOSIS — F411 Generalized anxiety disorder: Secondary | ICD-10-CM | POA: Diagnosis not present

## 2023-11-09 ENCOUNTER — Ambulatory Visit
Admission: RE | Admit: 2023-11-09 | Discharge: 2023-11-09 | Disposition: A | Discharge status: Home / Self Care | Source: Ambulatory Visit | Attending: Family Medicine | Admitting: Family Medicine

## 2023-11-09 DIAGNOSIS — R222 Localized swelling, mass and lump, trunk: Secondary | ICD-10-CM

## 2023-11-19 DIAGNOSIS — F411 Generalized anxiety disorder: Secondary | ICD-10-CM | POA: Diagnosis not present

## 2023-11-19 DIAGNOSIS — F603 Borderline personality disorder: Secondary | ICD-10-CM | POA: Diagnosis not present

## 2023-11-19 DIAGNOSIS — F3174 Bipolar disorder, in full remission, most recent episode manic: Secondary | ICD-10-CM | POA: Diagnosis not present

## 2023-12-02 DIAGNOSIS — M2550 Pain in unspecified joint: Secondary | ICD-10-CM | POA: Diagnosis not present

## 2023-12-03 DIAGNOSIS — F603 Borderline personality disorder: Secondary | ICD-10-CM | POA: Diagnosis not present

## 2023-12-03 DIAGNOSIS — F3174 Bipolar disorder, in full remission, most recent episode manic: Secondary | ICD-10-CM | POA: Diagnosis not present

## 2023-12-03 DIAGNOSIS — F411 Generalized anxiety disorder: Secondary | ICD-10-CM | POA: Diagnosis not present

## 2023-12-17 DIAGNOSIS — F411 Generalized anxiety disorder: Secondary | ICD-10-CM | POA: Diagnosis not present

## 2023-12-17 DIAGNOSIS — F603 Borderline personality disorder: Secondary | ICD-10-CM | POA: Diagnosis not present

## 2023-12-17 DIAGNOSIS — F3174 Bipolar disorder, in full remission, most recent episode manic: Secondary | ICD-10-CM | POA: Diagnosis not present

## 2023-12-31 DIAGNOSIS — F411 Generalized anxiety disorder: Secondary | ICD-10-CM | POA: Diagnosis not present

## 2023-12-31 DIAGNOSIS — F603 Borderline personality disorder: Secondary | ICD-10-CM | POA: Diagnosis not present

## 2023-12-31 DIAGNOSIS — F3174 Bipolar disorder, in full remission, most recent episode manic: Secondary | ICD-10-CM | POA: Diagnosis not present

## 2024-01-14 DIAGNOSIS — F411 Generalized anxiety disorder: Secondary | ICD-10-CM | POA: Diagnosis not present

## 2024-01-14 DIAGNOSIS — F3174 Bipolar disorder, in full remission, most recent episode manic: Secondary | ICD-10-CM | POA: Diagnosis not present

## 2024-01-14 DIAGNOSIS — F603 Borderline personality disorder: Secondary | ICD-10-CM | POA: Diagnosis not present

## 2024-01-21 DIAGNOSIS — M25562 Pain in left knee: Secondary | ICD-10-CM | POA: Diagnosis not present

## 2024-01-21 DIAGNOSIS — M79642 Pain in left hand: Secondary | ICD-10-CM | POA: Diagnosis not present

## 2024-01-21 DIAGNOSIS — M79641 Pain in right hand: Secondary | ICD-10-CM | POA: Diagnosis not present

## 2024-01-21 DIAGNOSIS — R21 Rash and other nonspecific skin eruption: Secondary | ICD-10-CM | POA: Diagnosis not present

## 2024-01-21 DIAGNOSIS — M25561 Pain in right knee: Secondary | ICD-10-CM | POA: Diagnosis not present

## 2024-01-21 DIAGNOSIS — G8929 Other chronic pain: Secondary | ICD-10-CM | POA: Diagnosis not present

## 2024-01-27 DIAGNOSIS — F3174 Bipolar disorder, in full remission, most recent episode manic: Secondary | ICD-10-CM | POA: Diagnosis not present

## 2024-01-27 DIAGNOSIS — F411 Generalized anxiety disorder: Secondary | ICD-10-CM | POA: Diagnosis not present

## 2024-01-27 DIAGNOSIS — F603 Borderline personality disorder: Secondary | ICD-10-CM | POA: Diagnosis not present

## 2024-02-11 DIAGNOSIS — G8929 Other chronic pain: Secondary | ICD-10-CM | POA: Diagnosis not present

## 2024-02-11 DIAGNOSIS — F411 Generalized anxiety disorder: Secondary | ICD-10-CM | POA: Diagnosis not present

## 2024-02-11 DIAGNOSIS — F3174 Bipolar disorder, in full remission, most recent episode manic: Secondary | ICD-10-CM | POA: Diagnosis not present

## 2024-02-11 DIAGNOSIS — M25561 Pain in right knee: Secondary | ICD-10-CM | POA: Diagnosis not present

## 2024-02-11 DIAGNOSIS — M79642 Pain in left hand: Secondary | ICD-10-CM | POA: Diagnosis not present

## 2024-02-11 DIAGNOSIS — M79641 Pain in right hand: Secondary | ICD-10-CM | POA: Diagnosis not present

## 2024-02-11 DIAGNOSIS — F603 Borderline personality disorder: Secondary | ICD-10-CM | POA: Diagnosis not present

## 2024-03-03 DIAGNOSIS — F3174 Bipolar disorder, in full remission, most recent episode manic: Secondary | ICD-10-CM | POA: Diagnosis not present

## 2024-03-03 DIAGNOSIS — F411 Generalized anxiety disorder: Secondary | ICD-10-CM | POA: Diagnosis not present

## 2024-03-03 DIAGNOSIS — F603 Borderline personality disorder: Secondary | ICD-10-CM | POA: Diagnosis not present

## 2024-03-15 ENCOUNTER — Ambulatory Visit: Admitting: Physician Assistant

## 2024-03-17 DIAGNOSIS — F411 Generalized anxiety disorder: Secondary | ICD-10-CM | POA: Diagnosis not present

## 2024-03-17 DIAGNOSIS — F603 Borderline personality disorder: Secondary | ICD-10-CM | POA: Diagnosis not present

## 2024-03-17 DIAGNOSIS — F3174 Bipolar disorder, in full remission, most recent episode manic: Secondary | ICD-10-CM | POA: Diagnosis not present

## 2024-03-22 ENCOUNTER — Ambulatory Visit (INDEPENDENT_AMBULATORY_CARE_PROVIDER_SITE_OTHER): Admitting: Physician Assistant

## 2024-03-22 ENCOUNTER — Encounter: Payer: Self-pay | Admitting: Physician Assistant

## 2024-03-22 VITALS — BP 117/74 | HR 75

## 2024-03-22 DIAGNOSIS — L209 Atopic dermatitis, unspecified: Secondary | ICD-10-CM

## 2024-03-22 MED ORDER — BETAMETHASONE DIPROPIONATE AUG 0.05 % EX CREA: TOPICAL_CREAM | CUTANEOUS | 4 refills | Status: AC

## 2024-03-22 NOTE — Patient Instructions (Signed)

## 2024-03-22 NOTE — Progress Notes (Unsigned)
   New Patient Visit   Subjective  [name] is a 23 y.o. adult who presents for the following: spot check  Patient states he has spots located at the fingers and feet that he would like to have examined. Patient reports the areas have been there for 1 year. He reports the areas are bothersome. Patient rates irritation 8 out of 10. He states that the areas have not spread. Patient reports he has previously been treated for these areas he was given some antifungal cream by the foot doctor . Patient denies Hx of bx. Patient denies family and self  history of skin cancer(s).  The patient has spots, moles and lesions to be evaluated, some may be new or changing and the patient may have concern these could be cancer.   The following portions of the chart were reviewed this encounter and updated as appropriate: medications, allergies, medical history  Review of Systems:  No other skin or systemic complaints except as noted in HPI or Assessment and Plan.  Objective  Well appearing patient in no apparent distress; mood and affect are within normal limits.  A focused examination was performed of the following areas: Hands and feet  Relevant exam findings are noted in the Assessment and Plan.                Assessment & Plan   ATOPIC DERMATITIS Exam: Scaly pink papules coalescing to plaques ***% BSA  wellcontrolled vs notatgoal vs flared vs improved***  Atopic dermatitis (eczema) is a chronic, relapsing, pruritic condition that can significantly affect quality of life. It is often associated with allergic rhinitis and/or asthma and can require treatment with topical medications, phototherapy, or in severe cases biologic injectable medication (Dupixent; Adbry) or Oral JAK inhibitors.  Treatment Plan: ***  Recommend gentle skin care.     ATOPIC DERMATITIS, UNSPECIFIED TYPE   Related Medications augmented betamethasone  dipropionate (DIPROLENE -AF) 0.05 % cream Apply twice  daily until clear then two time weekly  Return in about 3 months (around 06/21/2024) for Atopic Dermatitis follow up .  I, Doyce Pan, CMA, am acting as scribe for Melady Chow K, PA-C.   Documentation: I have reviewed the above documentation for accuracy and completeness, and I agree with the above.  Darol Cush K, PA-C

## 2024-04-07 DIAGNOSIS — F603 Borderline personality disorder: Secondary | ICD-10-CM | POA: Diagnosis not present

## 2024-04-07 DIAGNOSIS — F3174 Bipolar disorder, in full remission, most recent episode manic: Secondary | ICD-10-CM | POA: Diagnosis not present

## 2024-04-21 DIAGNOSIS — F3174 Bipolar disorder, in full remission, most recent episode manic: Secondary | ICD-10-CM | POA: Diagnosis not present

## 2024-04-21 DIAGNOSIS — F411 Generalized anxiety disorder: Secondary | ICD-10-CM | POA: Diagnosis not present

## 2024-04-21 DIAGNOSIS — F603 Borderline personality disorder: Secondary | ICD-10-CM | POA: Diagnosis not present

## 2024-04-22 DIAGNOSIS — Z0184 Encounter for antibody response examination: Secondary | ICD-10-CM | POA: Diagnosis not present

## 2024-04-22 DIAGNOSIS — Z13220 Encounter for screening for lipoid disorders: Secondary | ICD-10-CM | POA: Diagnosis not present

## 2024-04-22 DIAGNOSIS — Z23 Encounter for immunization: Secondary | ICD-10-CM | POA: Diagnosis not present

## 2024-04-22 DIAGNOSIS — Z0000 Encounter for general adult medical examination without abnormal findings: Secondary | ICD-10-CM | POA: Diagnosis not present

## 2024-05-05 DIAGNOSIS — F3174 Bipolar disorder, in full remission, most recent episode manic: Secondary | ICD-10-CM | POA: Diagnosis not present

## 2024-05-05 DIAGNOSIS — F603 Borderline personality disorder: Secondary | ICD-10-CM | POA: Diagnosis not present

## 2024-05-10 DIAGNOSIS — M79642 Pain in left hand: Secondary | ICD-10-CM | POA: Diagnosis not present

## 2024-05-10 DIAGNOSIS — M79641 Pain in right hand: Secondary | ICD-10-CM | POA: Diagnosis not present

## 2024-05-10 DIAGNOSIS — G8929 Other chronic pain: Secondary | ICD-10-CM | POA: Diagnosis not present

## 2024-05-10 DIAGNOSIS — M25561 Pain in right knee: Secondary | ICD-10-CM | POA: Diagnosis not present

## 2024-05-19 DIAGNOSIS — F3174 Bipolar disorder, in full remission, most recent episode manic: Secondary | ICD-10-CM | POA: Diagnosis not present

## 2024-05-19 DIAGNOSIS — F603 Borderline personality disorder: Secondary | ICD-10-CM | POA: Diagnosis not present

## 2024-05-19 DIAGNOSIS — F411 Generalized anxiety disorder: Secondary | ICD-10-CM | POA: Diagnosis not present

## 2024-06-02 DIAGNOSIS — F411 Generalized anxiety disorder: Secondary | ICD-10-CM | POA: Diagnosis not present

## 2024-06-02 DIAGNOSIS — F603 Borderline personality disorder: Secondary | ICD-10-CM | POA: Diagnosis not present

## 2024-06-02 DIAGNOSIS — F3174 Bipolar disorder, in full remission, most recent episode manic: Secondary | ICD-10-CM | POA: Diagnosis not present

## 2024-06-13 ENCOUNTER — Ambulatory Visit: Admitting: Physician Assistant

## 2024-06-16 DIAGNOSIS — F603 Borderline personality disorder: Secondary | ICD-10-CM | POA: Diagnosis not present

## 2024-06-16 DIAGNOSIS — F411 Generalized anxiety disorder: Secondary | ICD-10-CM | POA: Diagnosis not present

## 2024-06-16 DIAGNOSIS — F3174 Bipolar disorder, in full remission, most recent episode manic: Secondary | ICD-10-CM | POA: Diagnosis not present

## 2024-06-24 DIAGNOSIS — F4312 Post-traumatic stress disorder, chronic: Secondary | ICD-10-CM | POA: Diagnosis not present

## 2024-06-24 DIAGNOSIS — F319 Bipolar disorder, unspecified: Secondary | ICD-10-CM | POA: Diagnosis not present

## 2024-06-24 DIAGNOSIS — F603 Borderline personality disorder: Secondary | ICD-10-CM | POA: Diagnosis not present

## 2024-06-27 DIAGNOSIS — J069 Acute upper respiratory infection, unspecified: Secondary | ICD-10-CM | POA: Diagnosis not present

## 2024-09-20 ENCOUNTER — Ambulatory Visit: Admitting: Physician Assistant
